# Patient Record
Sex: Female | Born: 1950 | Race: Black or African American | Hispanic: No | Marital: Single | Smoking: Never smoker
Health system: Southern US, Community
[De-identification: ages and names within clinical notes are randomized; demographics above are authoritative.]

## PROBLEM LIST (undated history)

## (undated) DIAGNOSIS — I10 Essential (primary) hypertension: Secondary | ICD-10-CM

---

## 2011-04-28 DIAGNOSIS — G4733 Obstructive sleep apnea (adult) (pediatric): Secondary | ICD-10-CM | POA: Diagnosis present

## 2011-12-08 DIAGNOSIS — M170 Bilateral primary osteoarthritis of knee: Secondary | ICD-10-CM | POA: Diagnosis present

## 2013-08-22 DIAGNOSIS — K648 Other hemorrhoids: Secondary | ICD-10-CM | POA: Insufficient documentation

## 2013-08-22 DIAGNOSIS — K5730 Diverticulosis of large intestine without perforation or abscess without bleeding: Secondary | ICD-10-CM | POA: Insufficient documentation

## 2014-07-24 DIAGNOSIS — K430 Incisional hernia with obstruction, without gangrene: Secondary | ICD-10-CM | POA: Diagnosis present

## 2014-07-27 DIAGNOSIS — Z8619 Personal history of other infectious and parasitic diseases: Secondary | ICD-10-CM | POA: Insufficient documentation

## 2019-10-13 ENCOUNTER — Encounter (HOSPITAL_COMMUNITY): Admission: EM | Disposition: A | Discharge status: Home Health | Payer: Self-pay | Source: Home / Self Care

## 2019-10-13 ENCOUNTER — Emergency Department (HOSPITAL_COMMUNITY): Payer: Medicare (Managed Care) | Admitting: Certified Registered Nurse Anesthetist

## 2019-10-13 ENCOUNTER — Encounter (HOSPITAL_COMMUNITY): Payer: Self-pay

## 2019-10-13 ENCOUNTER — Emergency Department (HOSPITAL_COMMUNITY): Payer: Medicare (Managed Care)

## 2019-10-13 ENCOUNTER — Other Ambulatory Visit: Payer: Self-pay

## 2019-10-13 ENCOUNTER — Inpatient Hospital Stay (HOSPITAL_COMMUNITY)
Admission: EM | Admit: 2019-10-13 | Discharge: 2019-10-22 | DRG: 329 | Disposition: A | Discharge status: Home Health | Payer: Medicare (Managed Care) | Attending: Surgery | Admitting: Surgery

## 2019-10-13 DIAGNOSIS — N200 Calculus of kidney: Secondary | ICD-10-CM

## 2019-10-13 DIAGNOSIS — I959 Hypotension, unspecified: Secondary | ICD-10-CM | POA: Diagnosis not present

## 2019-10-13 DIAGNOSIS — Z9889 Other specified postprocedural states: Secondary | ICD-10-CM

## 2019-10-13 DIAGNOSIS — I10 Essential (primary) hypertension: Secondary | ICD-10-CM | POA: Diagnosis present

## 2019-10-13 DIAGNOSIS — Z6841 Body Mass Index (BMI) 40.0 and over, adult: Secondary | ICD-10-CM

## 2019-10-13 DIAGNOSIS — Z20822 Contact with and (suspected) exposure to covid-19: Secondary | ICD-10-CM | POA: Diagnosis present

## 2019-10-13 DIAGNOSIS — K460 Unspecified abdominal hernia with obstruction, without gangrene: Secondary | ICD-10-CM

## 2019-10-13 DIAGNOSIS — E119 Type 2 diabetes mellitus without complications: Secondary | ICD-10-CM | POA: Diagnosis present

## 2019-10-13 DIAGNOSIS — K55021 Focal (segmental) acute infarction of small intestine: Secondary | ICD-10-CM | POA: Diagnosis present

## 2019-10-13 DIAGNOSIS — K431 Incisional hernia with gangrene: Secondary | ICD-10-CM | POA: Diagnosis not present

## 2019-10-13 DIAGNOSIS — Z880 Allergy status to penicillin: Secondary | ICD-10-CM

## 2019-10-13 DIAGNOSIS — M170 Bilateral primary osteoarthritis of knee: Secondary | ICD-10-CM | POA: Diagnosis present

## 2019-10-13 DIAGNOSIS — K56609 Unspecified intestinal obstruction, unspecified as to partial versus complete obstruction: Secondary | ICD-10-CM

## 2019-10-13 DIAGNOSIS — G4733 Obstructive sleep apnea (adult) (pediatric): Secondary | ICD-10-CM | POA: Diagnosis present

## 2019-10-13 DIAGNOSIS — K567 Ileus, unspecified: Secondary | ICD-10-CM | POA: Diagnosis not present

## 2019-10-13 DIAGNOSIS — K430 Incisional hernia with obstruction, without gangrene: Secondary | ICD-10-CM | POA: Diagnosis present

## 2019-10-13 DIAGNOSIS — D1771 Benign lipomatous neoplasm of kidney: Secondary | ICD-10-CM

## 2019-10-13 DIAGNOSIS — K5730 Diverticulosis of large intestine without perforation or abscess without bleeding: Secondary | ICD-10-CM | POA: Diagnosis present

## 2019-10-13 HISTORY — DX: Essential (primary) hypertension: I10

## 2019-10-13 HISTORY — PX: BOWEL RESECTION: SHX1257

## 2019-10-13 HISTORY — PX: APPLICATION OF WOUND VAC: SHX5189

## 2019-10-13 HISTORY — PX: LAPAROTOMY: SHX154

## 2019-10-13 HISTORY — PX: INCISIONAL HERNIA REPAIR: SHX193

## 2019-10-13 LAB — CBC WITH DIFFERENTIAL/PLATELET
Abs Immature Granulocytes: 0.05 10*3/uL (ref 0.00–0.07)
Basophils Absolute: 0 10*3/uL (ref 0.0–0.1)
Basophils Relative: 0 %
Eosinophils Absolute: 0 10*3/uL (ref 0.0–0.5)
Eosinophils Relative: 0 %
HCT: 42.4 % (ref 36.0–46.0)
Hemoglobin: 14 g/dL (ref 12.0–15.0)
Immature Granulocytes: 0 %
Lymphocytes Relative: 9 %
Lymphs Abs: 1.5 10*3/uL (ref 0.7–4.0)
MCH: 30.4 pg (ref 26.0–34.0)
MCHC: 33 g/dL (ref 30.0–36.0)
MCV: 92.2 fL (ref 80.0–100.0)
Monocytes Absolute: 1.7 10*3/uL — ABNORMAL HIGH (ref 0.1–1.0)
Monocytes Relative: 11 %
Neutro Abs: 13.2 10*3/uL — ABNORMAL HIGH (ref 1.7–7.7)
Neutrophils Relative %: 80 %
Platelets: 246 10*3/uL (ref 150–400)
RBC: 4.6 MIL/uL (ref 3.87–5.11)
RDW: 13.2 % (ref 11.5–15.5)
WBC: 16.5 10*3/uL — ABNORMAL HIGH (ref 4.0–10.5)
nRBC: 0 % (ref 0.0–0.2)

## 2019-10-13 LAB — COMPREHENSIVE METABOLIC PANEL
ALT: 19 U/L (ref 0–44)
AST: 23 U/L (ref 15–41)
Albumin: 4 g/dL (ref 3.5–5.0)
Alkaline Phosphatase: 23 U/L — ABNORMAL LOW (ref 38–126)
Anion gap: 12 (ref 5–15)
BUN: 22 mg/dL (ref 8–23)
CO2: 29 mmol/L (ref 22–32)
Calcium: 9.8 mg/dL (ref 8.9–10.3)
Chloride: 101 mmol/L (ref 98–111)
Creatinine, Ser: 0.71 mg/dL (ref 0.44–1.00)
GFR calc Af Amer: >60 mL/min (ref >60)
GFR calc non Af Amer: >60 mL/min (ref >60)
Glucose, Bld: 138 mg/dL — ABNORMAL HIGH (ref 70–99)
Potassium: 3.7 mmol/L (ref 3.5–5.1)
Sodium: 142 mmol/L (ref 135–145)
Total Bilirubin: 0.7 mg/dL (ref 0.3–1.2)
Total Protein: 8.4 g/dL — ABNORMAL HIGH (ref 6.5–8.1)

## 2019-10-13 LAB — PHOSPHORUS: Phosphorus: 3.4 mg/dL (ref 2.5–4.6)

## 2019-10-13 LAB — URINALYSIS, ROUTINE W REFLEX MICROSCOPIC
Bilirubin Urine: NEGATIVE
Glucose, UA: NEGATIVE mg/dL
Hgb urine dipstick: NEGATIVE
Ketones, ur: NEGATIVE mg/dL
Nitrite: NEGATIVE
Protein, ur: NEGATIVE mg/dL
Specific Gravity, Urine: 1.01 (ref 1.005–1.030)
pH: 5 (ref 5.0–8.0)

## 2019-10-13 LAB — MAGNESIUM: Magnesium: 2.1 mg/dL (ref 1.7–2.4)

## 2019-10-13 LAB — LIPASE, BLOOD: Lipase: 37 U/L (ref 11–51)

## 2019-10-13 LAB — SARS CORONAVIRUS 2 BY RT PCR (HOSPITAL ORDER, PERFORMED IN ~~LOC~~ HOSPITAL LAB): SARS Coronavirus 2: NEGATIVE

## 2019-10-13 SURGERY — LAPAROTOMY, EXPLORATORY
Anesthesia: General | Site: Abdomen

## 2019-10-13 MED ORDER — MORPHINE SULFATE (PF) 4 MG/ML IV SOLN
4.0000 mg | Freq: Once | INTRAVENOUS | Status: AC
  Administered 2019-10-13: 4 mg via INTRAVENOUS
  Filled 2019-10-13: qty 1

## 2019-10-13 MED ORDER — IOHEXOL 300 MG/ML  SOLN
100.0000 mL | Freq: Once | INTRAMUSCULAR | Status: AC | PRN
  Administered 2019-10-13: 100 mL via INTRAVENOUS

## 2019-10-13 MED ORDER — GENTAMICIN SULFATE 40 MG/ML IJ SOLN
400.0000 mg | INTRAVENOUS | Status: AC
  Administered 2019-10-13: 400 mg via INTRAVENOUS
  Filled 2019-10-13: qty 10

## 2019-10-13 MED ORDER — DEXAMETHASONE SODIUM PHOSPHATE 10 MG/ML IJ SOLN
INTRAMUSCULAR | Status: AC
  Filled 2019-10-13: qty 1

## 2019-10-13 MED ORDER — SODIUM CHLORIDE (PF) 0.9 % IJ SOLN
INTRAMUSCULAR | Status: AC
  Filled 2019-10-13: qty 50

## 2019-10-13 MED ORDER — LIDOCAINE 2% (20 MG/ML) 5 ML SYRINGE
INTRAMUSCULAR | Status: AC
  Filled 2019-10-13: qty 5

## 2019-10-13 MED ORDER — BUPIVACAINE LIPOSOME 1.3 % IJ SUSP
20.0000 mL | Freq: Once | INTRAMUSCULAR | Status: DC
  Filled 2019-10-13: qty 20

## 2019-10-13 MED ORDER — CLINDAMYCIN PHOSPHATE 900 MG/50ML IV SOLN
900.0000 mg | INTRAVENOUS | Status: AC
  Administered 2019-10-13: 900 mg via INTRAVENOUS

## 2019-10-13 MED ORDER — LACTATED RINGERS IV BOLUS: 1000.0000 mL | Freq: Three times a day (TID) | INTRAVENOUS | Status: AC | PRN

## 2019-10-13 MED ORDER — ONDANSETRON HCL 4 MG/2ML IJ SOLN
INTRAMUSCULAR | Status: AC
  Filled 2019-10-13: qty 2

## 2019-10-13 MED ORDER — LACTATED RINGERS IV BOLUS
1000.0000 mL | Freq: Once | INTRAVENOUS | Status: AC
  Administered 2019-10-13: 1000 mL via INTRAVENOUS

## 2019-10-13 MED ORDER — FENTANYL CITRATE (PF) 250 MCG/5ML IJ SOLN
INTRAMUSCULAR | Status: AC
  Filled 2019-10-13: qty 5

## 2019-10-13 MED ORDER — CLINDAMYCIN PHOSPHATE 900 MG/50ML IV SOLN
INTRAVENOUS | Status: AC
  Filled 2019-10-13: qty 50

## 2019-10-13 MED ORDER — MEPERIDINE HCL 50 MG/ML IJ SOLN: 6.2500 mg | INTRAMUSCULAR | Status: DC | PRN

## 2019-10-13 MED ORDER — SODIUM CHLORIDE 0.9 % IV BOLUS
1000.0000 mL | Freq: Once | INTRAVENOUS | Status: AC
  Administered 2019-10-13: 1000 mL via INTRAVENOUS

## 2019-10-13 MED ORDER — BUPIVACAINE HCL 0.25 % IJ SOLN
INTRAMUSCULAR | Status: AC
  Filled 2019-10-13: qty 1

## 2019-10-13 MED ORDER — MIDAZOLAM HCL 2 MG/2ML IJ SOLN
INTRAMUSCULAR | Status: AC
  Filled 2019-10-13: qty 2

## 2019-10-13 MED ORDER — HYDROMORPHONE HCL 1 MG/ML IJ SOLN: 0.2500 mg | INTRAMUSCULAR | Status: DC | PRN

## 2019-10-13 MED ORDER — ONDANSETRON HCL 4 MG/2ML IJ SOLN
4.0000 mg | Freq: Once | INTRAMUSCULAR | Status: AC | PRN
  Administered 2019-10-14: 4 mg via INTRAVENOUS

## 2019-10-13 MED ORDER — LORAZEPAM 2 MG/ML IJ SOLN
1.0000 mg | Freq: Once | INTRAMUSCULAR | Status: AC
  Administered 2019-10-13: 1 mg via INTRAVENOUS
  Filled 2019-10-13: qty 1

## 2019-10-13 MED ORDER — PROPOFOL 10 MG/ML IV BOLUS
INTRAVENOUS | Status: AC
  Filled 2019-10-13: qty 20

## 2019-10-13 MED ORDER — CHLORHEXIDINE GLUCONATE CLOTH 2 % EX PADS
6.0000 | MEDICATED_PAD | Freq: Once | CUTANEOUS | Status: AC
  Administered 2019-10-13: 6 via TOPICAL

## 2019-10-13 MED ORDER — ROCURONIUM BROMIDE 10 MG/ML (PF) SYRINGE
PREFILLED_SYRINGE | INTRAVENOUS | Status: AC
  Filled 2019-10-13: qty 10

## 2019-10-13 SURGICAL SUPPLY — 92 items
APPLIER CLIP 5 13 M/L LIGAMAX5 (MISCELLANEOUS)
APPLIER CLIP ROT 10 11.4 M/L (STAPLE)
CABLE HIGH FREQUENCY MONO STRZ (ELECTRODE) ×4 IMPLANT
CANISTER WOUND CARE 500ML ATS (WOUND CARE) ×2 IMPLANT
CELLS DAT CNTRL 66122 CELL SVR (MISCELLANEOUS) ×2 IMPLANT
CHLORAPREP W/TINT 26 (MISCELLANEOUS) ×6 IMPLANT
CLIP APPLIE 5 13 M/L LIGAMAX5 (MISCELLANEOUS) IMPLANT
CLIP APPLIE ROT 10 11.4 M/L (STAPLE) IMPLANT
COUNTER NEEDLE 20 DBL MAG RED (NEEDLE) ×4 IMPLANT
COVER MAYO STAND STRL (DRAPES) ×12 IMPLANT
COVER SURGICAL LIGHT HANDLE (MISCELLANEOUS) ×4 IMPLANT
COVER WAND RF STERILE (DRAPES) IMPLANT
DECANTER SPIKE VIAL GLASS SM (MISCELLANEOUS) ×4 IMPLANT
DRAIN CHANNEL 19F RND (DRAIN) IMPLANT
DRAPE LAPAROSCOPIC ABDOMINAL (DRAPES) ×4 IMPLANT
DRAPE SURG IRRIG POUCH 19X23 (DRAPES) ×4 IMPLANT
DRSG OPSITE POSTOP 4X10 (GAUZE/BANDAGES/DRESSINGS) IMPLANT
DRSG OPSITE POSTOP 4X6 (GAUZE/BANDAGES/DRESSINGS) IMPLANT
DRSG OPSITE POSTOP 4X8 (GAUZE/BANDAGES/DRESSINGS) IMPLANT
DRSG TEGADERM 2-3/8X2-3/4 SM (GAUZE/BANDAGES/DRESSINGS) ×10 IMPLANT
DRSG TEGADERM 4X4.75 (GAUZE/BANDAGES/DRESSINGS) IMPLANT
DRSG VAC ATS MED SENSATRAC (GAUZE/BANDAGES/DRESSINGS) ×2 IMPLANT
ELECT PENCIL ROCKER SW 15FT (MISCELLANEOUS) ×2 IMPLANT
ELECT REM PT RETURN 15FT ADLT (MISCELLANEOUS) ×4 IMPLANT
ENDOLOOP SUT PDS II  0 18 (SUTURE)
ENDOLOOP SUT PDS II 0 18 (SUTURE) IMPLANT
EVACUATOR SILICONE 100CC (DRAIN) IMPLANT
GAUZE SPONGE 2X2 8PLY STRL LF (GAUZE/BANDAGES/DRESSINGS) ×2 IMPLANT
GAUZE SPONGE 4X4 12PLY STRL (GAUZE/BANDAGES/DRESSINGS) ×4 IMPLANT
GLOVE BIO SURGEON STRL SZ7.5 (GLOVE) ×2 IMPLANT
GLOVE BIOGEL PI IND STRL 7.5 (GLOVE) IMPLANT
GLOVE BIOGEL PI IND STRL 8 (GLOVE) IMPLANT
GLOVE BIOGEL PI INDICATOR 7.5 (GLOVE) ×6
GLOVE BIOGEL PI INDICATOR 8 (GLOVE) ×2
GLOVE ECLIPSE 8.0 STRL XLNG CF (GLOVE) ×8 IMPLANT
GLOVE INDICATOR 8.0 STRL GRN (GLOVE) ×10 IMPLANT
GOWN STRL REUS W/ TWL LRG LVL3 (GOWN DISPOSABLE) IMPLANT
GOWN STRL REUS W/TWL LRG LVL3 (GOWN DISPOSABLE) ×2
GOWN STRL REUS W/TWL XL LVL3 (GOWN DISPOSABLE) ×12 IMPLANT
HANDLE SUCTION POOLE (INSTRUMENTS) IMPLANT
IRRIG SUCT STRYKERFLOW 2 WTIP (MISCELLANEOUS) ×4
IRRIGATION SUCT STRKRFLW 2 WTP (MISCELLANEOUS) ×2 IMPLANT
KIT TURNOVER KIT A (KITS) ×2 IMPLANT
LEGGING LITHOTOMY PAIR STRL (DRAPES) IMPLANT
NDL INSUFFLATION 14GA 150MM (NEEDLE) IMPLANT
NEEDLE INSUFFLATION 14GA 150MM (NEEDLE) ×4 IMPLANT
PAD POSITIONING PINK XL (MISCELLANEOUS) ×4 IMPLANT
PENCIL SMOKE EVACUATOR (MISCELLANEOUS) IMPLANT
PORT LAP GEL ALEXIS MED 5-9CM (MISCELLANEOUS) IMPLANT
PROTECTOR NERVE ULNAR (MISCELLANEOUS) IMPLANT
RETRACTOR WND ALEXIS 18 MED (MISCELLANEOUS) IMPLANT
RTRCTR WOUND ALEXIS 18CM MED (MISCELLANEOUS) ×4
SCISSORS LAP 5X35 DISP (ENDOMECHANICALS) ×4 IMPLANT
SEALER TISSUE G2 STRG ARTC 35C (ENDOMECHANICALS) IMPLANT
SET TUBE SMOKE EVAC HIGH FLOW (TUBING) ×4 IMPLANT
SLEEVE ADV FIXATION 5X100MM (TROCAR) IMPLANT
SLEEVE XCEL OPT CAN 5 100 (ENDOMECHANICALS) IMPLANT
SPONGE GAUZE 2X2 STER 10/PKG (GAUZE/BANDAGES/DRESSINGS) ×2
SPONGE LAP 18X18 RF (DISPOSABLE) ×2 IMPLANT
STAPLER 90 3.5 STAND SLIM (STAPLE) ×4
STAPLER 90 3.5 STD SLIM (STAPLE) IMPLANT
STAPLER PROXIMATE 75MM BLUE (STAPLE) ×2 IMPLANT
STAPLER VISISTAT 35W (STAPLE) IMPLANT
SUCTION POOLE HANDLE (INSTRUMENTS) ×4
SURGILUBE 2OZ TUBE FLIPTOP (MISCELLANEOUS) IMPLANT
SUT MNCRL AB 4-0 PS2 18 (SUTURE) ×4 IMPLANT
SUT PDS AB 1 CT1 27 (SUTURE) ×4 IMPLANT
SUT PDS AB 1 CTX 36 (SUTURE) ×8 IMPLANT
SUT PDS AB 1 TP1 96 (SUTURE) IMPLANT
SUT PROLENE 0 CT 2 (SUTURE) IMPLANT
SUT PROLENE 2 0 SH DA (SUTURE) IMPLANT
SUT SILK 2 0 (SUTURE) ×4
SUT SILK 2 0 SH CR/8 (SUTURE) ×6 IMPLANT
SUT SILK 2-0 18XBRD TIE 12 (SUTURE) ×2 IMPLANT
SUT SILK 3 0 (SUTURE) ×2
SUT SILK 3 0 SH CR/8 (SUTURE) ×4 IMPLANT
SUT SILK 3-0 18XBRD TIE 12 (SUTURE) ×2 IMPLANT
SUT VICRYL 0 UR6 27IN ABS (SUTURE) ×4 IMPLANT
SYS LAPSCP GELPORT 120MM (MISCELLANEOUS)
SYSTEM LAPSCP GELPORT 120MM (MISCELLANEOUS) IMPLANT
TAPE UMBILICAL COTTON 1/8X30 (MISCELLANEOUS) ×4 IMPLANT
TOWEL OR 17X26 10 PK STRL BLUE (TOWEL DISPOSABLE) IMPLANT
TOWEL OR NON WOVEN STRL DISP B (DISPOSABLE) ×4 IMPLANT
TRAY COLON PACK (CUSTOM PROCEDURE TRAY) ×4 IMPLANT
TRAY FOLEY MTR SLVR 16FR STAT (SET/KITS/TRAYS/PACK) ×2 IMPLANT
TROCAR ADV FIXATION 5X100MM (TROCAR) IMPLANT
TROCAR BLADELESS OPT 5 100 (ENDOMECHANICALS) ×4 IMPLANT
TROCAR XCEL NON-BLD 11X100MML (ENDOMECHANICALS) IMPLANT
TUBING CONNECTING 10 (TUBING) IMPLANT
TUBING CONNECTING 10' (TUBING)
TUBING IRRIGATION (MISCELLANEOUS) ×2 IMPLANT
YANKAUER SUCT BULB TIP NO VENT (SUCTIONS) ×2 IMPLANT

## 2019-10-13 NOTE — ED Provider Notes (Signed)
[facility] DEPT Provider Note   CSN: [id number] Arrival date & time: 10/13/19  1808     History Chief Complaint  Patient presents with  . Emesis  . Abdominal Pain    Stephanie Blanchard is a 69 y.o. female.  Pt presents to the ED today with n/v and abdominal pain.  Pt has a known ventral hernia, but she said it is "out."  She denies any f/c.  No diarrhea.  EMS gave her zofran IV en route.  Pt has no covid sx or exposures.  She has had both doses of her vaccine.        Past Medical History:  Diagnosis Date  . Hypertension     Patient Active Problem List   Diagnosis Date Noted  . 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 Nephrolithiasis 10/13/2019  . SBO (small bowel obstruction) ([place]) 10/13/2019  . Personal history of other infectious and parasitic diseases 07/27/2014  . Incarcerated incisional hernia 07/24/2014  . Diverticulosis of sigmoid colon 08/22/2013  . Internal hemorrhoid 08/22/2013  . Bilateral primary osteoarthritis of knee 12/08/2011  . Obstructive sleep apnea 04/28/2011  . Morbid obesity with body mass index (BMI) of 45.0 to 49.9 in adult [facility]) 09/30/2010  . HTN (hypertension) 08/01/2009    Past Surgical History:  Procedure Laterality Date  . CESAREAN SECTION  1980     OB History   No obstetric history on file.     History reviewed. No pertinent family history.  Social History   Tobacco Use  . Smoking status: Never Smoker  . Smokeless tobacco: Never Used  Substance Use Topics  . Alcohol use: Yes    Comment: occ  . Drug use: Never    Home Medications Prior to Admission medications   Medication Sig Start Date End Date Taking? Authorizing Provider  losartan-hydrochlorothiazide (HYZAAR) 100-25 MG tablet Take 1 tablet by mouth daily. 10/08/19  Yes [provider]    Allergies    Penicillins  Review of Systems   Review of Systems  Gastrointestinal: Positive for abdominal pain, nausea  and vomiting.  All other systems reviewed and are negative.   Physical Exam Updated Vital Signs BP (!) 138/57 (BP Location: Right Arm)   Pulse 84   Temp 98.6 F (37 C) (Oral)   Resp 16   Ht 5\' 2"  (1.575 m)   Wt 123.8 kg   SpO2 100%   BMI 49.93 kg/m   Physical Exam Vitals and nursing note reviewed.  Constitutional:      Appearance: She is well-developed. She is obese.  HENT:     Head: Normocephalic and atraumatic.     Mouth/Throat:     Mouth: Mucous membranes are dry.  Eyes:     Extraocular Movements: Extraocular movements intact.     Pupils: Pupils are equal, round, and reactive to light.  Cardiovascular:     Rate and Rhythm: Normal rate and regular rhythm.  Pulmonary:     Effort: Pulmonary effort is normal.     Breath sounds: Normal breath sounds.  Abdominal:     General: Abdomen is flat.     Palpations: Abdomen is soft.     Hernia: A hernia is present. Hernia is present in the ventral area.  Skin:    General: Skin is warm.     Capillary Refill: Capillary refill takes less than 2 seconds.  Neurological:     General: No focal deficit present.     Mental Status: She is  alert and oriented to person, place, and time.  Psychiatric:        Mood and Affect: Mood normal.        Behavior: Behavior normal.     ED Results / Procedures / Treatments   Labs (all labs ordered are listed, but only abnormal results are displayed) Labs Reviewed  CBC WITH DIFFERENTIAL/PLATELET - Abnormal; Notable for the following components:      Result Value   WBC 16.5 (*)    Neutro Abs 13.2 (*)    Monocytes Absolute 1.7 (*)    All other components within normal limits  COMPREHENSIVE METABOLIC PANEL - Abnormal; Notable for the following components:   Glucose, Bld 138 (*)    Total Protein 8.4 (*)    Alkaline Phosphatase 23 (*)    All other components within normal limits  URINALYSIS, ROUTINE W REFLEX MICROSCOPIC - Abnormal; Notable for the following components:   Leukocytes,Ua TRACE  (*)    Bacteria, UA RARE (*)    All other components within normal limits  SARS CORONAVIRUS 2 BY RT PCR (HOSPITAL ORDER, [facility])  LIPASE, BLOOD  MAGNESIUM  PHOSPHORUS  PREALBUMIN  HEMOGLOBIN A1C  CBC  BASIC METABOLIC PANEL    EKG EKG Interpretation  Date/Time:  Thursday October 13 2019 22:50:06 EDT Ventricular Rate:  81 PR Interval:    QRS Duration: 97 QT Interval:  404 QTC Calculation: 469 R Axis:   17 Text Interpretation: Sinus rhythm Biatrial enlargement Left ventricular hypertrophy Anterior Q waves, possibly due to LVH No old tracing to compare Confirmed by Isla Pence [phone]) on 10/13/2019 11:09:40 PM   Radiology CT ABDOMEN PELVIS W CONTRAST  Result Date: 10/13/2019 CLINICAL DATA:  Nausea and vomiting.  Right lower quadrant mass. EXAM: CT ABDOMEN AND PELVIS WITH CONTRAST TECHNIQUE: Multidetector CT imaging of the abdomen and pelvis was performed using the standard protocol following bolus administration of intravenous contrast. CONTRAST:  121mL OMNIPAQUE IOHEXOL 300 MG/ML  SOLN COMPARISON:  None. FINDINGS: Lower chest: No acute abnormality. Hepatobiliary: No focal liver abnormality is seen. No gallstones, gallbladder wall thickening, or biliary dilatation. Pancreas: Unremarkable. No pancreatic ductal dilatation or surrounding inflammatory changes. Spleen: Normal in size without focal abnormality. Adrenals/Urinary Tract: The adrenal glands are unremarkable. 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 Subcentimeter low-density lesion in the upper pole of the right kidney is too small to characterize. 1.6 cm simple cyst in the left kidney. Tiny nonobstructive calculi in the lower poles of both kidneys. No hydronephrosis. The bladder is unremarkable. Stomach/Bowel: Large right infraumbilical hernia containing fat and a single loop of mildly dilated small bowel with trace ascites and mild surrounding inflammatory changes. No wall thickening  or pneumatosis. Mild dilatation of the small bowel immediately proximal to the hernia. Mild left-sided colonic diverticulosis. The stomach is within normal limits. Normal appendix. Vascular/Lymphatic: Aortic atherosclerosis. No enlarged abdominal or pelvic lymph nodes. Reproductive: Uterus and bilateral adnexa are unremarkable. Other: No free fluid or pneumoperitoneum. Musculoskeletal: No acute or significant osseous findings. IMPRESSION: 1. Large right infraumbilical hernia containing fat and a single loop of mildly dilated small bowel with trace ascites and mild surrounding inflammatory changes, concerning for strangulation. Mild dilatation of the small bowel immediately proximal to the hernia, consistent with early obstruction. 2. Bilateral nonobstructive nephrolithiasis. 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 4. Aortic Atherosclerosis (ICD10-I70.0). Electronically Signed   By: Titus Dubin M.D.   On: 10/13/2019 21:04    Procedures Hernia reduction  Date/Time: 10/13/2019 9:41 PM Performed by: Isla Pence, MD Authorized by: Isla Pence, MD  Consent: Verbal consent obtained. Risks and benefits: risks, benefits and alternatives were discussed Consent given by: patient Patient identity confirmed: verbally with patient Time out: Immediately prior to procedure a "time out" was called to verify the correct patient, procedure, equipment, support staff and site/side marked as required.  Sedation: Patient sedated: no  Patient tolerance: patient tolerated the procedure well with no immediate complications Comments: I was unable to get hernia to reduce.    (including critical care time)  Medications Ordered in ED Medications  sodium chloride (PF) 0.9 % injection (has no administration in time range)  lactated ringers bolus 1,000 mL (has no administration in time range)  clindamycin (CLEOCIN) IVPB 900 mg (has no administration in time range)    And  gentamicin (GARAMYCIN) 400 mg in  dextrose 5 % 100 mL IVPB (has no administration in time range)  bupivacaine liposome (EXPAREL) 1.3 % injection 266 mg (has no administration in time range)  sodium chloride 0.9 % bolus 1,000 mL (0 mLs Intravenous Stopped 10/13/19 2035)  morphine 4 MG/ML injection 4 mg (4 mg Intravenous Given 10/13/19 1848)  LORazepam (ATIVAN) injection 1 mg (1 mg Intravenous Given 10/13/19 2235)  iohexol (OMNIPAQUE) 300 MG/ML solution 100 mL (100 mLs Intravenous Contrast Given 10/13/19 2037)  lactated ringers bolus 1,000 mL (1,000 mLs Intravenous New Bag/Given 10/13/19 2235)  Chlorhexidine Gluconate Cloth 2 % PADS 6 each (6 each Topical Given 10/13/19 2238)    ED Course  I have reviewed the triage vital signs and the nursing notes.  Pertinent labs & imaging results that were available during my care of the patient were reviewed by me and considered in my medical decision making (see chart for details).    MDM Rules/Calculators/A&P                      Pt d/w Dr. Johney Maine (surgery).  He requests NG tube and he will come see pt.  Dr. Johney Maine did see pt and he did take pt to the OR.  Final Clinical Impression(s) / ED Diagnoses Final diagnoses:  Hernia with strangulation  SBO (small bowel obstruction) ([place])    Rx / DC Orders ED Discharge Orders    None       Isla Pence, MD 10/13/19 2310

## 2019-10-13 NOTE — ED Triage Notes (Signed)
Pt arrives [facility] from home. Pt is from [place] and is in town visiting. Pt reports N/V beginning yesterday at 10 am. Pt reports that while vomiting she felt a "pop". Pt reports she now has a mass in the RLQ. Pt reports hx of hernia without complication.

## 2019-10-13 NOTE — Anesthesia Preprocedure Evaluation (Signed)
Anesthesia Evaluation  Patient identified by MRN, date of birth, ID band Patient awake    Reviewed: Allergy & Precautions, NPO status , Patient's Chart, lab work & pertinent test results  Airway Mallampati: II  TM Distance: >3 FB Neck ROM: Full    Dental   Pulmonary sleep apnea and Continuous Positive Airway Pressure Ventilation ,    Pulmonary exam normal        Cardiovascular hypertension, Pt. on medications Normal cardiovascular exam     Neuro/Psych    GI/Hepatic   Endo/Other    Renal/GU      Musculoskeletal   Abdominal   Peds  Hematology   Anesthesia Other Findings   Reproductive/Obstetrics                             Anesthesia Physical Anesthesia Plan  ASA: III and emergent  Anesthesia Plan: General   Post-op Pain Management:    Induction: Intravenous, Rapid sequence and Cricoid pressure planned  PONV Risk Score and Plan: 3 and Ondansetron, Midazolam and Treatment may vary due to age or medical condition  Airway Management Planned: Oral ETT  Additional Equipment:   Intra-op Plan:   Post-operative Plan: Possible Post-op intubation/ventilation  Informed Consent: I have reviewed the patients History and Physical, chart, labs and discussed the procedure including the risks, benefits and alternatives for the proposed anesthesia with the patient or authorized representative who has indicated his/her understanding and acceptance.       Plan Discussed with: CRNA and Surgeon  Anesthesia Plan Comments:         Anesthesia Quick Evaluation

## 2019-10-13 NOTE — ED Notes (Signed)
Pt provided with warm blanket. 

## 2019-10-13 NOTE — H&P (Signed)
Stephanie Blanchard  Sep 29, 1950 [id number]  CARE TEAM:  PCP: System, Pcp Not In  Outpatient Care Team: Patient Care Team: System, Pcp Not In as PCP - General  Inpatient Treatment Team: Treatment Team: Attending Provider: Isla Pence, MD; Technician: Gwendolyn Fill, NT; Registered Nurse: Markus Daft, RN; Registered Nurse: [place], [place], RN; Respiratory Therapist: Nelly Laurence, RRT; Consulting Physician: Edison Pace, Md, MD   This patient is a 69 y.o.female who presents today for surgical evaluation at the request of Dr Gilford Raid, [facility] ED.   Chief complaint / Reason for evaluation: Chronic incisional hernia now incarcerated.  "Can we just do the surgery now?"  Morbidly obese woman with prior history of C-section through low midline incisions visiting from [place].  Has had a hernia lump known for many years.  CAT scan at outside hospital system talks about fat-containing hernia but no bowel.  Patient claims that the hernia is usually been reducible.  However it came out and stayed out since yesterday.  Worsening pain with nausea and vomiting.  Concerned.  Came into the emergency room tonight.  Seen by emergency MD.  Not felt to be reducible.  CAT scan shows a narrow fascial defect with a moderate size hernia sac containing small bowel with dilatation and transition point.  Concerning for incarcerated hernia causing small bowel obstruction.  Surgical consultation requested.  Patient denies any cardiac or pulmonary issues.  She does not smoke.  He has been tested and denies any diagnosis of diabetes.  No kidney problems.  She is not on any blood thinners.  She is not had any abdominal surgery since her last C-section over 40 years ago.  No history of skin infections.  No history of prior hernia repairs or any mesh.  No major bladder or bowel issues need for emergency surgery   Assessment  Stephanie Blanchard  69 y.o. 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   Nephrolithiasis   Bilateral primary osteoarthritis of knee   Infraumbilical incisional hernia incarcerated with small bowel causing bowel obstruction.  Edema concerning for strangulation  Plan:  Hospital admission.  Emergent operative exploration.  Diagnostic laparoscopy with laparoscopic reduction.  Possible bowel resection.  Probable primary open repair.   The anatomy & physiology of the digestive tract was discussed.  The pathophysiology of hernias with incarceration and strangulation and bowel obstruction was discussed.   Natural history risks without surgery such as death was discussed.  I recommended abdominal exploration to diagnose & treat the source of the problem.  Laparoscopic & open techniques were discussed.   Risks such as bleeding, infection, abscess, leak, reoperation, bowel resection, possible ostomy, injury to other organs, need for repair of tissues / organs, hernia, heart attack, death, and other risks were discussed.   The risks of no intervention will lead to serious problems including death.   I expressed a good likelihood that surgery will address the problem.    Goals of post-operative recovery were discussed as well.  We will work to minimize complications although risks in an emergent setting are high.   Questions were answered.  Mother listening on the cell phone up in [place] as well.  The patient expressed understanding & wishes to proceed  with surgery.       IV fluid rehydration.  Nausea control.  Diabetic control.  VTE prophylaxis- SCDs, etc  Mobilize as tolerated to help recovery  45 minutes spent in review, evaluation, examination, counseling, and coordination of care.  More than 50% of that time was spent in counseling.  Adin Hector,  MD, FACS, MASCRS Gastrointestinal and Minimally Invasive Surgery  [facility] [address] [place], [place] [address] [phone] Fax [phone] Main/Paging  CONTACT INFORMATION: Weekday (9AM-5PM) concerns: Call [facility] main office at [phone] Weeknight (5PM-9AM) or Weekend/Holiday concerns: Check [email or web address] for General Surgery [facility] coverage (Please, do not use SecureChat as it is not reliable communication to operating surgeons for immediate patient care)      10/13/2019      Past Medical History:  Diagnosis Date  . Hypertension     Past Surgical History:  Procedure Laterality Date  . ABDOMINAL SURGERY    . CESAREAN SECTION      Social History   Socioeconomic History  . Marital status: Single    Spouse name: Not on file  . Number of children: Not on file  . Years of education: Not on file  . Highest education level: Not on file  Occupational History  . Not on file  Tobacco Use  . Smoking status: Never Smoker  . Smokeless tobacco: Never Used  Substance and Sexual Activity  . Alcohol use: Yes    Comment: occ  . Drug use: Never  . Sexual activity: Not on file  Other Topics Concern  . Not on file  Social History Narrative  . Not on file   Social Determinants of Health   Financial Resource Strain:   . Difficulty of Paying Living Expenses:   Food Insecurity:   . Worried About Charity fundraiser in the Last Year:   . Arboriculturist in the Last Year:   Transportation Needs:   . Film/video editor (Medical):   Marland Kitchen Lack of Transportation (Non-Medical):   Physical Activity:   . Days of Exercise per Week:   . Minutes of Exercise per Session:   Stress:   . Feeling of Stress :   Social Connections:   . Frequency of Communication with Friends and Family:   . Frequency of Social Gatherings with Friends and Family:   . Attends Religious Services:   . Active Member of Clubs or Organizations:   . Attends Theatre manager Meetings:   Marland Kitchen Marital Status:   Intimate Partner Violence:   . Fear of Current or Ex-Partner:   . Emotionally Abused:   Marland Kitchen Physically Abused:   . Sexually Abused:     History reviewed. No pertinent family history.  Current Facility-Administered Medications  Medication Dose Route Frequency Provider Last Rate Last Admin  . LORazepam (ATIVAN) injection 1 mg  1 mg Intravenous Once Isla Pence, MD      . sodium chloride (PF) 0.9 % injection            Current Outpatient Medications  Medication Sig Dispense Refill  . losartan-hydrochlorothiazide (HYZAAR) 100-25 MG tablet Take 1 tablet by mouth daily.       Allergies  Allergen Reactions  . Penicillins     Did it involve swelling of the face/tongue/throat, SOB, or low BP? N Did it involve sudden or severe rash/hives, skin peeling, or any reaction on the inside of your mouth or nose? Y Did you need to seek medical  attention at a hospital or doctor's office? N When did it last happen?Several years ago If all above answers are "NO", may proceed with cephalosporin use.     ROS:   All other systems reviewed & are negative except per HPI or as noted below: Constitutional:  No fevers, chills, sweats.  Weight stable Eyes:  No vision changes, No discharge HENT:  No sore throats, nasal drainage Lymph: No neck swelling, No bruising easily Pulmonary:  No cough, productive sputum CV: No orthopnea, PND  Patient walks 20 minutes for about 1 miles without difficulty.  No exertional chest/neck/shoulder/arm pain. GI:  No personal nor family history of GI/colon cancer, inflammatory bowel disease, irritable bowel syndrome, allergy such as Celiac Sprue, dietary/dairy problems, colitis, ulcers nor gastritis.  No recent sick contacts/gastroenteritis.  No travel outside the country.  No changes in diet.  Questionable history hepatitis C treatment in the past  renal: No UTIs, No hematuria.  + History of kidney stones Genital:  No  drainage, bleeding, masses Musculoskeletal: No severe joint pain.  Good ROM major joints Skin:  No sores or lesions.  No rashes Heme/Lymph:  No easy bleeding.  No swollen lymph nodes Neuro: No focal weakness/numbness.  No seizures Psych: No suicidal ideation.  No hallucinations  BP (!) 184/84 (BP Location: Right Arm)   Pulse 85   Temp 98.5 F (36.9 C) (Oral)   Resp 16   Ht 5\' 2"  (1.575 m)   Wt 123.8 kg   SpO2 100%   BMI 49.93 kg/m   Physical Exam: Constitutional: Not cachectic.  Hygeine adequate.  Lying on side.  Tired but not toxic.  Vitals signs as above.   Eyes: Pupils reactive, normal extraocular movements. Sclera nonicteric Neuro: CN II-XII intact.  No major focal sensory defects.  No major motor deficits. Lymph: No head/neck/groin lymphadenopathy Psych:  No severe agitation.  No severe anxiety.  Judgment & insight Adequate, Oriented x4, HENT: Normocephalic, Mucus membranes moist.  No thrush.   Neck: Supple, No tracheal deviation.  No obvious thyromegaly Chest: No pain to chest wall compression.  Good respiratory excursion.  No audible wheezing CV:  Pulses intact.   Regular rhythm.  No major extremity edema  Abdomen:  Morbidly obese.   Soft.  Nondistended.  12x10cm spherical Grapefruit size mass right of infraumbilical region with edema and swelling correlates with incarcerated hernia.  Not reducible.  Sensitive to deep pressure.  No frank fluctuance or gangrene at the surface.   No hepatomegaly.  No splenomegaly  Gen:  No inguinal hernias.  No inguinal lymphadenopathy.   Ext: No obvious deformity or contracture no significant edema.  No cyanosis Skin: No major subcutaneous nodules.  Warm and dry Musculoskeletal: Severe joint rigidity not present.  No obvious clubbing.  No digital petechiae.     Results:   Labs: Results for orders placed or performed during the hospital encounter of 10/13/19 (from the past 48 hour(s))  CBC with Differential     Status: Abnormal    Collection Time: 10/13/19  6:41 PM  Result Value Ref Range   WBC 16.5 (H) 4.0 - 10.5 K/uL   RBC 4.60 3.87 - 5.11 MIL/uL   Hemoglobin 14.0 12.0 - 15.0 g/dL   HCT 42.4 36.0 - 46.0 %   MCV 92.2 80.0 - 100.0 fL   MCH 30.4 26.0 - 34.0 pg   MCHC 33.0 30.0 - 36.0 g/dL   RDW 13.2 11.5 - 15.5 %   Platelets 246 150 - 400 K/uL  nRBC 0.0 0.0 - 0.2 %   Neutrophils Relative % 80 %   Neutro Abs 13.2 (H) 1.7 - 7.7 K/uL   Lymphocytes Relative 9 %   Lymphs Abs 1.5 0.7 - 4.0 K/uL   Monocytes Relative 11 %   Monocytes Absolute 1.7 (H) 0.1 - 1.0 K/uL   Eosinophils Relative 0 %   Eosinophils Absolute 0.0 0.0 - 0.5 K/uL   Basophils Relative 0 %   Basophils Absolute 0.0 0.0 - 0.1 K/uL   Immature Granulocytes 0 %   Abs Immature Granulocytes 0.05 0.00 - 0.07 K/uL    Comment: Performed at [facility], [place] [address], [place], [place] [address]  Comprehensive metabolic panel     Status: Abnormal   Collection Time: 10/13/19  6:41 PM  Result Value Ref Range   Sodium 142 135 - 145 mmol/L   Potassium 3.7 3.5 - 5.1 mmol/L   Chloride 101 98 - 111 mmol/L   CO2 29 22 - 32 mmol/L   Glucose, Bld 138 (H) 70 - 99 mg/dL    Comment: Glucose reference range applies only to samples taken after fasting for at least 8 hours.   BUN 22 8 - 23 mg/dL   Creatinine, Ser 0.71 0.44 - 1.00 mg/dL   Calcium 9.8 8.9 - 10.3 mg/dL   Total Protein 8.4 (H) 6.5 - 8.1 g/dL   Albumin 4.0 3.5 - 5.0 g/dL   AST 23 15 - 41 U/L   ALT 19 0 - 44 U/L   Alkaline Phosphatase 23 (L) 38 - 126 U/L   Total Bilirubin 0.7 0.3 - 1.2 mg/dL   GFR calc non Af Amer >60 >60 mL/min   GFR calc Af Amer >60 >60 mL/min   Anion gap 12 5 - 15    Comment: Performed at [facility], [place] [address] [place], [place] [address]  Lipase, blood     Status: None   Collection Time: 10/13/19  6:41 PM  Result Value Ref Range   Lipase 37 11 - 51 U/L    Comment: Performed at [facility], [place] [address],  [place], [place] [address]    Imaging / Studies: CT ABDOMEN PELVIS W CONTRAST  Result Date: 10/13/2019 CLINICAL DATA:  Nausea and vomiting.  Right lower quadrant mass. EXAM: CT ABDOMEN AND PELVIS WITH CONTRAST TECHNIQUE: Multidetector CT imaging of the abdomen and pelvis was performed using the standard protocol following bolus administration of intravenous contrast. CONTRAST:  175mL OMNIPAQUE IOHEXOL 300 MG/ML  SOLN COMPARISON:  None. FINDINGS: Lower chest: No acute abnormality. Hepatobiliary: No focal liver abnormality is seen. No gallstones, gallbladder wall thickening, or biliary dilatation. Pancreas: Unremarkable. No pancreatic ductal dilatation or surrounding inflammatory changes. Spleen: Normal in size without focal abnormality. Adrenals/Urinary Tract: The adrenal glands are unremarkable. 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 Subcentimeter low-density lesion in the upper pole of the right kidney is too small to characterize. 1.6 cm simple cyst in the left kidney. Tiny nonobstructive calculi in the lower poles of both kidneys. No hydronephrosis. The bladder is unremarkable. Stomach/Bowel: Large right infraumbilical hernia containing fat and a single loop of mildly dilated small bowel with trace ascites and mild surrounding inflammatory changes. No wall thickening or pneumatosis. Mild dilatation of the small bowel immediately proximal to the hernia. Mild left-sided colonic diverticulosis. The stomach is within normal limits. Normal appendix. Vascular/Lymphatic: Aortic atherosclerosis. No enlarged abdominal or pelvic lymph nodes. Reproductive: Uterus and bilateral adnexa  are unremarkable. Other: No free fluid or pneumoperitoneum. Musculoskeletal: No acute or significant osseous findings. IMPRESSION: 1. Large right infraumbilical hernia containing fat and a single loop of mildly dilated small bowel with trace ascites and mild surrounding inflammatory changes, concerning for strangulation.  Mild dilatation of the small bowel immediately proximal to the hernia, consistent with early obstruction. 2. Bilateral nonobstructive nephrolithiasis. 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 4. Aortic Atherosclerosis (ICD10-I70.0). Electronically Signed   By: Titus Dubin M.D.   On: 10/13/2019 21:04    Medications / Allergies: per chart  Antibiotics: Anti-infectives (From admission, onward)   None        Note: Portions of this report may have been transcribed using voice recognition software. Every effort was made to ensure accuracy; however, inadvertent computerized transcription errors may be present.   Any transcriptional errors that result from this process are unintentional.    Adin Hector, MD, FACS, MASCRS Gastrointestinal and Minimally Invasive Surgery  [facility] [address] [place], [place] [address] [phone] Fax [phone] Main/Paging  CONTACT INFORMATION: Weekday (9AM-5PM) concerns: Call [facility] main office at [phone] Weeknight (5PM-9AM) or Weekend/Holiday concerns: Check [email or web address] for General Surgery [facility] coverage (Please, do not use SecureChat as it is not reliable communication to operating surgeons for immediate patient care)      10/13/2019  9:53 PM

## 2019-10-13 NOTE — Interval H&P Note (Signed)
History and Physical Interval Note:  10/13/2019 11:07 PM  Stephanie Blanchard  has presented today for surgery, with the diagnosis of hernia.  The various methods of treatment have been discussed with the patient and family. After consideration of risks, benefits and other options for treatment, the patient has consented to  [name] a surgical intervention.  The patient's history has been reviewed, patient examined, no change in status, stable for surgery.  I have reviewed the patient's chart and labs.  Questions were answered to the patient's satisfaction.     Adin Hector

## 2019-10-14 ENCOUNTER — Encounter (HOSPITAL_COMMUNITY): Payer: Self-pay

## 2019-10-14 ENCOUNTER — Inpatient Hospital Stay (HOSPITAL_COMMUNITY): Payer: Medicare (Managed Care)

## 2019-10-14 DIAGNOSIS — I10 Essential (primary) hypertension: Secondary | ICD-10-CM | POA: Diagnosis present

## 2019-10-14 DIAGNOSIS — M170 Bilateral primary osteoarthritis of knee: Secondary | ICD-10-CM | POA: Diagnosis present

## 2019-10-14 DIAGNOSIS — N200 Calculus of kidney: Secondary | ICD-10-CM | POA: Diagnosis present

## 2019-10-14 DIAGNOSIS — K55021 Focal (segmental) acute infarction of small intestine: Secondary | ICD-10-CM | POA: Diagnosis present

## 2019-10-14 DIAGNOSIS — Z6841 Body Mass Index (BMI) 40.0 and over, adult: Secondary | ICD-10-CM | POA: Diagnosis not present

## 2019-10-14 DIAGNOSIS — I959 Hypotension, unspecified: Secondary | ICD-10-CM | POA: Diagnosis not present

## 2019-10-14 DIAGNOSIS — Z20822 Contact with and (suspected) exposure to covid-19: Secondary | ICD-10-CM | POA: Diagnosis present

## 2019-10-14 DIAGNOSIS — G4733 Obstructive sleep apnea (adult) (pediatric): Secondary | ICD-10-CM | POA: Diagnosis present

## 2019-10-14 DIAGNOSIS — K5730 Diverticulosis of large intestine without perforation or abscess without bleeding: Secondary | ICD-10-CM | POA: Diagnosis present

## 2019-10-14 DIAGNOSIS — D1771 Benign lipomatous neoplasm of kidney: Secondary | ICD-10-CM | POA: Diagnosis present

## 2019-10-14 DIAGNOSIS — K430 Incisional hernia with obstruction, without gangrene: Secondary | ICD-10-CM | POA: Diagnosis present

## 2019-10-14 DIAGNOSIS — E119 Type 2 diabetes mellitus without complications: Secondary | ICD-10-CM | POA: Diagnosis present

## 2019-10-14 DIAGNOSIS — K431 Incisional hernia with gangrene: Secondary | ICD-10-CM | POA: Diagnosis present

## 2019-10-14 DIAGNOSIS — K567 Ileus, unspecified: Secondary | ICD-10-CM | POA: Diagnosis not present

## 2019-10-14 DIAGNOSIS — Z880 Allergy status to penicillin: Secondary | ICD-10-CM | POA: Diagnosis not present

## 2019-10-14 LAB — CBC
HCT: 40.5 % (ref 36.0–46.0)
Hemoglobin: 12.8 g/dL (ref 12.0–15.0)
MCH: 29.8 pg (ref 26.0–34.0)
MCHC: 31.6 g/dL (ref 30.0–36.0)
MCV: 94.2 fL (ref 80.0–100.0)
Platelets: 223 10*3/uL (ref 150–400)
RBC: 4.3 MIL/uL (ref 3.87–5.11)
RDW: 13.5 % (ref 11.5–15.5)
WBC: 19 10*3/uL — ABNORMAL HIGH (ref 4.0–10.5)
nRBC: 0 % (ref 0.0–0.2)

## 2019-10-14 LAB — HEMOGLOBIN A1C
Hgb A1c MFr Bld: 6 % — ABNORMAL HIGH (ref 4.8–5.6)
Mean Plasma Glucose: 125.5 mg/dL

## 2019-10-14 LAB — BASIC METABOLIC PANEL
Anion gap: 10 (ref 5–15)
BUN: 20 mg/dL (ref 8–23)
CO2: 27 mmol/L (ref 22–32)
Calcium: 8.9 mg/dL (ref 8.9–10.3)
Chloride: 103 mmol/L (ref 98–111)
Creatinine, Ser: 0.88 mg/dL (ref 0.44–1.00)
GFR calc Af Amer: >60 mL/min (ref >60)
GFR calc non Af Amer: >60 mL/min (ref >60)
Glucose, Bld: 143 mg/dL — ABNORMAL HIGH (ref 70–99)
Potassium: 3.6 mmol/L (ref 3.5–5.1)
Sodium: 140 mmol/L (ref 135–145)

## 2019-10-14 LAB — PREALBUMIN: Prealbumin: 25 mg/dL (ref 18–38)

## 2019-10-14 LAB — HIV ANTIBODY (ROUTINE TESTING W REFLEX): HIV Screen 4th Generation wRfx: NONREACTIVE

## 2019-10-14 MED ORDER — LACTATED RINGERS IR SOLN
Status: DC | PRN
  Administered 2019-10-14: 1000 mL

## 2019-10-14 MED ORDER — SIMETHICONE 80 MG PO CHEW: 40.0000 mg | CHEWABLE_TABLET | Freq: Four times a day (QID) | ORAL | Status: DC | PRN

## 2019-10-14 MED ORDER — ALUM & MAG HYDROXIDE-SIMETH 200-200-20 MG/5ML PO SUSP: 30.0000 mL | Freq: Four times a day (QID) | ORAL | Status: DC | PRN

## 2019-10-14 MED ORDER — BISACODYL 10 MG RE SUPP
10.0000 mg | Freq: Every day | RECTAL | Status: DC | PRN
  Administered 2019-10-17 – 2019-10-19 (×2): 10 mg via RECTAL
  Filled 2019-10-14 (×2): qty 1

## 2019-10-14 MED ORDER — PHENYLEPHRINE HCL-NACL 10-0.9 MG/250ML-% IV SOLN
INTRAVENOUS | Status: DC | PRN
  Administered 2019-10-14: 100 ug/min via INTRAVENOUS

## 2019-10-14 MED ORDER — 0.9 % SODIUM CHLORIDE (POUR BTL) OPTIME
TOPICAL | Status: DC | PRN
  Administered 2019-10-14: 1000 mL

## 2019-10-14 MED ORDER — CIPROFLOXACIN IN D5W 400 MG/200ML IV SOLN
400.0000 mg | Freq: Two times a day (BID) | INTRAVENOUS | Status: DC
  Administered 2019-10-14 – 2019-10-18 (×9): 400 mg via INTRAVENOUS
  Filled 2019-10-14 (×9): qty 200

## 2019-10-14 MED ORDER — FAMOTIDINE IN NACL 20-0.9 MG/50ML-% IV SOLN
20.0000 mg | Freq: Two times a day (BID) | INTRAVENOUS | Status: DC
  Administered 2019-10-14 – 2019-10-21 (×15): 20 mg via INTRAVENOUS
  Filled 2019-10-14 (×15): qty 50

## 2019-10-14 MED ORDER — MIDAZOLAM HCL 5 MG/5ML IJ SOLN
INTRAMUSCULAR | Status: DC | PRN
  Administered 2019-10-13: 2 mg via INTRAVENOUS

## 2019-10-14 MED ORDER — METRONIDAZOLE IN NACL 5-0.79 MG/ML-% IV SOLN
500.0000 mg | Freq: Three times a day (TID) | INTRAVENOUS | Status: DC
  Administered 2019-10-14 – 2019-10-18 (×13): 500 mg via INTRAVENOUS
  Filled 2019-10-14 (×13): qty 100

## 2019-10-14 MED ORDER — LACTATED RINGERS IV SOLN: INTRAVENOUS | Status: DC | PRN

## 2019-10-14 MED ORDER — GENTAMICIN SULFATE 40 MG/ML IJ SOLN
560.0000 mg | INTRAVENOUS | Status: AC
  Administered 2019-10-14 – 2019-10-18 (×5): 560 mg via INTRAVENOUS
  Filled 2019-10-14 (×6): qty 14

## 2019-10-14 MED ORDER — SODIUM CHLORIDE 0.9 % IV SOLN
8.0000 mg | Freq: Four times a day (QID) | INTRAVENOUS | Status: DC | PRN
  Filled 2019-10-14: qty 4

## 2019-10-14 MED ORDER — METHOCARBAMOL 1000 MG/10ML IJ SOLN
1000.0000 mg | Freq: Four times a day (QID) | INTRAVENOUS | Status: DC | PRN
  Filled 2019-10-14: qty 10

## 2019-10-14 MED ORDER — DEXAMETHASONE SODIUM PHOSPHATE 10 MG/ML IJ SOLN
INTRAMUSCULAR | Status: DC | PRN
  Administered 2019-10-13: 10 mg via INTRAVENOUS

## 2019-10-14 MED ORDER — HYDROMORPHONE HCL 1 MG/ML IJ SOLN
0.5000 mg | INTRAMUSCULAR | Status: DC | PRN
  Administered 2019-10-14 – 2019-10-21 (×8): 1 mg via INTRAVENOUS
  Filled 2019-10-14 (×4): qty 1
  Filled 2019-10-14: qty 2
  Filled 2019-10-14 (×3): qty 1

## 2019-10-14 MED ORDER — SUGAMMADEX SODIUM 200 MG/2ML IV SOLN
INTRAVENOUS | Status: DC | PRN
  Administered 2019-10-14: 200 mg via INTRAVENOUS

## 2019-10-14 MED ORDER — PROPOFOL 10 MG/ML IV BOLUS
INTRAVENOUS | Status: DC | PRN
  Administered 2019-10-13: 120 mg via INTRAVENOUS

## 2019-10-14 MED ORDER — DIPHENHYDRAMINE HCL 50 MG/ML IJ SOLN: 12.5000 mg | Freq: Four times a day (QID) | INTRAMUSCULAR | Status: DC | PRN

## 2019-10-14 MED ORDER — LACTATED RINGERS IV BOLUS: 1000.0000 mL | Freq: Three times a day (TID) | INTRAVENOUS | Status: AC | PRN

## 2019-10-14 MED ORDER — PHENOL 1.4 % MT LIQD: 2.0000 | OROMUCOSAL | Status: DC | PRN

## 2019-10-14 MED ORDER — LACTATED RINGERS IV SOLN: INTRAVENOUS | Status: DC

## 2019-10-14 MED ORDER — PROCHLORPERAZINE MALEATE 10 MG PO TABS
10.0000 mg | ORAL_TABLET | Freq: Four times a day (QID) | ORAL | Status: DC | PRN
  Filled 2019-10-14: qty 1

## 2019-10-14 MED ORDER — MAGIC MOUTHWASH
15.0000 mL | Freq: Four times a day (QID) | ORAL | Status: DC | PRN
  Filled 2019-10-14: qty 15

## 2019-10-14 MED ORDER — STERILE WATER FOR IRRIGATION IR SOLN
Status: DC | PRN
  Administered 2019-10-14: 1000 mL

## 2019-10-14 MED ORDER — DIPHENHYDRAMINE HCL 12.5 MG/5ML PO ELIX: 12.5000 mg | ORAL_SOLUTION | Freq: Four times a day (QID) | ORAL | Status: DC | PRN

## 2019-10-14 MED ORDER — ONDANSETRON HCL 4 MG/2ML IJ SOLN
4.0000 mg | Freq: Four times a day (QID) | INTRAMUSCULAR | Status: DC | PRN
  Administered 2019-10-15 – 2019-10-18 (×2): 4 mg via INTRAVENOUS

## 2019-10-14 MED ORDER — METOPROLOL TARTRATE 5 MG/5ML IV SOLN: 5.0000 mg | Freq: Four times a day (QID) | INTRAVENOUS | Status: DC | PRN

## 2019-10-14 MED ORDER — SUCCINYLCHOLINE CHLORIDE 200 MG/10ML IV SOSY
PREFILLED_SYRINGE | INTRAVENOUS | Status: DC | PRN
  Administered 2019-10-13: 160 mg via INTRAVENOUS

## 2019-10-14 MED ORDER — BUPIVACAINE HCL (PF) 0.25 % IJ SOLN
INTRAMUSCULAR | Status: DC | PRN
  Administered 2019-10-14: 50 mL

## 2019-10-14 MED ORDER — ACETAMINOPHEN 650 MG RE SUPP: 650.0000 mg | Freq: Four times a day (QID) | RECTAL | Status: DC | PRN

## 2019-10-14 MED ORDER — MENTHOL 3 MG MT LOZG
1.0000 | LOZENGE | OROMUCOSAL | Status: DC | PRN
  Administered 2019-10-14: 3 mg via ORAL
  Filled 2019-10-14: qty 9

## 2019-10-14 MED ORDER — SODIUM CHLORIDE 0.9 % IV SOLN: Freq: Three times a day (TID) | INTRAVENOUS | Status: DC | PRN

## 2019-10-14 MED ORDER — LIP MEDEX EX OINT
1.0000 "application " | TOPICAL_OINTMENT | Freq: Two times a day (BID) | CUTANEOUS | Status: DC
  Administered 2019-10-14 – 2019-10-22 (×14): 1 via TOPICAL
  Filled 2019-10-14: qty 7

## 2019-10-14 MED ORDER — ENALAPRILAT 1.25 MG/ML IV SOLN
0.6250 mg | Freq: Four times a day (QID) | INTRAVENOUS | Status: DC | PRN
  Filled 2019-10-14: qty 1

## 2019-10-14 MED ORDER — ONDANSETRON 4 MG PO TBDP: 4.0000 mg | ORAL_TABLET | Freq: Four times a day (QID) | ORAL | Status: DC | PRN

## 2019-10-14 MED ORDER — PROCHLORPERAZINE EDISYLATE 10 MG/2ML IJ SOLN: 5.0000 mg | Freq: Four times a day (QID) | INTRAMUSCULAR | Status: DC | PRN

## 2019-10-14 MED ORDER — BUPIVACAINE LIPOSOME 1.3 % IJ SUSP
INTRAMUSCULAR | Status: DC | PRN
  Administered 2019-10-14: 20 mL

## 2019-10-14 MED ORDER — FENTANYL CITRATE (PF) 100 MCG/2ML IJ SOLN
INTRAMUSCULAR | Status: DC | PRN
  Administered 2019-10-13: 150 ug via INTRAVENOUS
  Administered 2019-10-14: 50 ug via INTRAVENOUS

## 2019-10-14 MED ORDER — ROCURONIUM BROMIDE 10 MG/ML (PF) SYRINGE
PREFILLED_SYRINGE | INTRAVENOUS | Status: DC | PRN
  Administered 2019-10-13: 50 mg via INTRAVENOUS
  Administered 2019-10-14: 20 mg via INTRAVENOUS

## 2019-10-14 MED ORDER — PHENYLEPHRINE 40 MCG/ML (10ML) SYRINGE FOR IV PUSH (FOR BLOOD PRESSURE SUPPORT)
PREFILLED_SYRINGE | INTRAVENOUS | Status: DC | PRN
  Administered 2019-10-13: 80 ug via INTRAVENOUS
  Administered 2019-10-13: 120 ug via INTRAVENOUS

## 2019-10-14 MED ORDER — ONDANSETRON HCL 4 MG/2ML IJ SOLN
4.0000 mg | Freq: Four times a day (QID) | INTRAMUSCULAR | Status: DC | PRN
  Filled 2019-10-14 (×2): qty 2

## 2019-10-14 MED ORDER — ENOXAPARIN SODIUM 40 MG/0.4ML ~~LOC~~ SOLN
40.0000 mg | Freq: Every day | SUBCUTANEOUS | Status: DC
  Administered 2019-10-14 – 2019-10-22 (×9): 40 mg via SUBCUTANEOUS
  Filled 2019-10-14 (×9): qty 0.4

## 2019-10-14 MED ORDER — CHLORHEXIDINE GLUCONATE CLOTH 2 % EX PADS
6.0000 | MEDICATED_PAD | Freq: Every day | CUTANEOUS | Status: DC
  Administered 2019-10-14: 6 via TOPICAL

## 2019-10-14 MED ORDER — LIDOCAINE 2% (20 MG/ML) 5 ML SYRINGE
INTRAMUSCULAR | Status: DC | PRN
  Administered 2019-10-13: 80 mg via INTRAVENOUS

## 2019-10-14 NOTE — Anesthesia Procedure Notes (Signed)
Procedure Name: Intubation Date/Time: 10/13/2019 11:44 PM Performed by: Gerald Leitz, CRNA Pre-anesthesia Checklist: Patient identified, Patient being monitored, Timeout performed, Emergency Drugs available and Suction available Patient Re-evaluated:Patient Re-evaluated prior to induction Oxygen Delivery Method: Circle system utilized Preoxygenation: Pre-oxygenation with 100% oxygen Induction Type: IV induction and Rapid sequence Ventilation: Mask ventilation without difficulty Laryngoscope Size: Mac and 3 Grade View: Grade I Tube type: Oral Tube size: 7.5 mm Number of attempts: 1 Airway Equipment and Method: Stylet Placement Confirmation: ETT inserted through vocal cords under direct vision,  positive ETCO2 and breath sounds checked- equal and bilateral Secured at: 22 cm Tube secured with: Tape Dental Injury: Teeth and Oropharynx as per pre-operative assessment

## 2019-10-14 NOTE — Transfer of Care (Signed)
Immediate Anesthesia Transfer of Care Note  Patient: Stephanie Blanchard  Procedure(s) Performed: Procedure(s): DIAGNOSTIC LAPAROTOMY AND BILATERAL TAP BLOCK REPAIR OF STRANGULATED INCISIONAL HERNIA AND OMENTECTOMY NECROTIC SMALL BOWEL RESECTION APPLICATION OF WOUND VAC  Patient Location: PACU  Anesthesia Type:General  Level of Consciousness: Alert, Awake, Oriented  Airway & Oxygen Therapy: Patient Spontanous Breathing  Post-op Assessment: Report given to RN  Post vital signs: Reviewed and stable  Last Vitals:  Vitals:   10/13/19 2200 10/13/19 2240  BP: (!) 163/85 (!) 138/57  Pulse:  84  Resp:  16  Temp:  37 C  SpO2:  499%    Complications: No apparent anesthesia complications

## 2019-10-14 NOTE — Progress Notes (Signed)
Pharmacy Antibiotic Note  Stephanie Blanchard is a 69 y.o. female admitted on 10/13/2019 with Intra-abdominal infection.  Pharmacy has been consulted for Gentamicin dosing.  Plan: Gentamicin 400mg  (~5mg /kg ABW--MD ordered) iv x1, then will use 7mg /kg iv q24hr---Gentamicin 560mg  iv q24hr Check 10hr level after  Ciprofloxacin 400mg  iv q12hr Flagyl 500mg  iv q8hr   Height: 5\' 2"  (157.5 cm) Weight: 123.8 kg (273 lb) IBW/kg (Calculated) : 50.1  Temp (24hrs), Avg:98.3 F (36.8 C), Min:97.5 F (36.4 C), Max:98.6 F (37 C)  Recent Labs  Lab 10/13/19 1841  WBC 16.5*  CREATININE 0.71    Estimated Creatinine Clearance: 85.7 mL/min (by C-G formula based on SCr of 0.71 mg/dL).    Allergies  Allergen Reactions  . Penicillins     Did it involve swelling of the face/tongue/throat, SOB, or low BP? N Did it involve sudden or severe rash/hives, skin peeling, or any reaction on the inside of your mouth or nose? Y Did you need to seek medical attention at a hospital or doctor's office? N When did it last happen?Several years ago If all above answers are "NO", may proceed with cephalosporin use.   . Red Blood Cells     [employer] Witness--refusal of blood, states she will receive albumin    Antimicrobials this admission: Ciprofloxacin 10/14/2019 >> Flagyl 10/14/2019 >> Gentamicin 10/14/2019 >>  Dose adjustments this admission: -  Microbiology results: -  Thank you for allowing pharmacy to be a part of this patient's care.  Nani Skillern Crowford 10/14/2019 4:52 AM

## 2019-10-14 NOTE — Progress Notes (Addendum)
Pt NGT came out while getting out of the chair. RN made 3 attempts to insert NGT. Pt began gagging and her nose began to bleed. [facility] on-call MD paged.   Coralie Keens return page and advised to leave the NGT out.

## 2019-10-14 NOTE — Op Note (Signed)
10/14/2019  2:02 AM  PATIENT:  Stephanie Blanchard  69 y.o. female  Patient Care Team: System, Pcp Not In as PCP - General  PRE-OPERATIVE DIAGNOSIS: Incarcerated incisional hernia, possible strangulated hernia   POST-OPERATIVE DIAGNOSIS:   Strangulated Incisional Hernia containing necrotic ileum and omentum. Small Bowel Obstruction  PROCEDURE:   REDUCTION & PRIMARY REPAIR OF STRANGULATED INCISIONAL HERNIA SMALL BOWEL RESECTION OMENTECTOMY DIAGNOSTIC LAPAROSCOPY PARTIAL PANNICULECTOMY APPLICATION OF WOUND VAC BILATERAL TAP BLOCK  SURGEON:  Adin Hector, MD  ASSISTANT: OR Staff   ANESTHESIA:     General  Nerve block provided with liposomal bupivacaine (Experel) mixed with 0.25% bupivacaine as a Bilateral TAP block x 78mL each side at the level of the transverse abdominis & preperitoneal spaces along the flank at the anterior axillary line, from subcostal ridge to iliac crest under laparoscopic guidance   Local field block at port sites & extraction wound  EBL:  Total I/O In: 2000 [I.V.:1000; IV Piggyback:1000] Out: 260 [Urine:160; Blood:100]  Delay start of Pharmacological VTE agent (>24hrs) due to surgical blood loss or risk of bleeding:  no  DRAINS: none   SPECIMEN: Necrotic proximal ileum and greater omentum   DISPOSITION OF SPECIMEN:  PATHOLOGY  COUNTS:  YES  PLAN OF CARE: Admit to inpatient   PATIENT DISPOSITION:  PACU - hemodynamically stable.  INDICATION:    Morbidly obese woman visiting from [place].  Has known lower abdominal hernia.  Usually is been reducible.  Came out became stuck for the past 24 hours and worsening nausea vomiting and pain.  CT scan concerning for small bowel contained within a narrow necked incisional hernia.  Large hernia sac.  Not reducible.  I recommended emergent laparoscopic probable open exploration.  Possible small bowel resection.   The anatomy & physiology of the digestive tract was discussed.  The pathophysiology of  perforation was discussed.  Differential diagnosis such as perforated ulcer or colon, etc was discussed.   Natural history risks without surgery such as death was discussed.  I recommended abdominal exploration to diagnose & treat the source of the problem.  Laparoscopic & open techniques were discussed.   Risks such as bleeding, infection, abscess, leak, reoperation, bowel resection, possible ostomy, injury to other organs, need for repair of tissues / organs, hernia, heart attack, death, and other risks were discussed.   The risks of no intervention will lead to serious problems including death.   I expressed a good likelihood that surgery will address the problem.    Goals of post-operative recovery were discussed as well.  We will work to minimize complications although risks in an emergent setting are high.   Questions were answered.  The patient expressed understanding & wishes to proceed with surgery.      The patient expresses understanding & wishes to proceed with surgery.  OR FINDINGS:   Patient had infraumbilical incisional hernia containing a large small for greater omentum as well as a 20 cm of small intestine at the jejunal/ileal junction.  Greater omentum necrotic with foul odor and purulence.  Small bowel black and completely ischemic with early gangrene.  Omentum resected.  Small bowel resection with immediate anastomosis done.  Resulting 6 x 5 cm fascial defect primarily repaired.    Very large hernia sac in central & right panniculus with thinned out poor tissues, necrotic subcutaneous tissues and purulence.  Therefore hernia sac and partial panniculectomy done    CASE DATA:  Type of patient?: LDOW CASE (Surgical Hospitalist [facility] Inpatient)  Status of Case? EMERGENT Add On  Infection Present At Time Of Surgery (PATOS)?  PURULENCE  DESCRIPTION:   Informed consent was confirmed.  The patient underwent general anaesthesia without difficulty.  The patient was positioned  appropriately.  VTE prevention in place.  The patient's abdomen was clipped, prepped, & draped in a sterile fashion.  Surgical timeout confirmed our plan.  Peritoneal entry with a laparoscopic port was obtained using Varess spring needle entry technique in the left upper abdomen as the patient was positioned in reverse Trendelenburg.  I induced carbon dioxide insufflation.  No change in end tidal CO2 measurements.  Full symmetrical abdominal distention.  1st port placed using optical entry technique in the  left upper abdomen.  Entry was clean.  I induced carbon dioxide insufflation.  Camera inspection revealed no injury.  Extra ports were carefully placed under direct laparoscopic visualization.  I could see large swath of greater omentum as well as small bowel going in to the central abdomen anteriorly.  I did lyse adhesions to help come around and identify the area of incarceration.  I transected around the hernia sac and was able to reduce part of the panniculus out.  However as it came more proximally and seemed like the omentum was very ischemic.  However it was get enough to reduce that I could start room reducing some small bowel.  As soon as I did it it was frankly necrotic with some evidence of gangrene.  I therefore did a counterincision through her prior low midline incision encountered a very large hernia sac.  I was able to come around it circumferentially through the some subcutaneous tissues.  I opened the hernia sac with a obvious foul odor with purulence and frank necrotic greater omentum.  Quite a large volume.  This was freed off and sent.  Small bowel was allowed to return to the abdomen and since there was no perforation yet.  Identified and transected most of the hernia sac.  I placed a wound protector.  I brought the small back out and ran it and confirmed the area of frank necrosis and purulence.  Therefore decided to transect this region.  I did a side-to-side staple anastomosis of  distal jejunum to proximal ileum with a 75 GIA stapler.  I transected it off the common staple defect with a TX 90 stapler.  I transected the ischemic/necrotic mesentery with clamp ties and harmonic scalpel.  Specimen removed.  Remaining mesentery was viable.  I did sutures to help laid the small bowel mesentery over the TX staple line to help protect it and close the mesenteric defect transversely.  I had to go through the fascia another centimeter proximally but is able to get this anastomosis reduced back inside.  Clamped to the wound protector and returned to diagnostic laparoscopy.  Found the ileocecal region and ran more proximally till he came to the anastomosis which laid well and was viable.  No evidence of any ischemia or necrosis or active bleeding.  The ileum had been rather decompressed.  Clearly the strangulated incisional hernia was a transition point for her bowel obstruction.  I ran the small bowel proximally.  No evidence of any perforation injury or other abnormality.  There was very little greater omentum left since most of it had been incarcerated hernia.  There were some areas of necrotic omentum infraumbilically going to her enlarged uterus.  This was transected off and removed.  I did copious irrigation of several liters of saline with  clear return.  Tap block had been placed.  Carbon dioxide was evacuated.  Ports were removed.  I ended up transecting the center and right part of the panniculus transversely since her subcutaneous tissues were quite ischemic with patches of necrosis and purulence.  Resulting 20 x 9 x 8 cm deep wound.  Had much healthier bleeding tissue with panniculus.  I reapproximated the fascia transversely with #1 PDS in a running fashion from each corner.  Came together to without much tension.  I closed the 5 mm laparoscopic port sites with 4 Monocryl sterile dressings.  Because there had been purulence necrosis and gangrene, I did not feel was safe to close the  skin.  Therefore I placed a gray sponge medium size wound VAC of the large hernia sac/pannicular wound.  Held suction well.  Patient extubated & in PACU recovery room.  She had had some hypotension at the start of the case but was hemodynamically stable by the end.  Stable now.  Felt by anesthesia & myself safe to go to the floor.  I discussed operative findings, updated the patient's status, discussed probable steps to recovery, and gave postoperative recommendations to the patient's son, Remus Loffler.  Recommendations were made.  Questions were answered.  He expressed understanding & appreciation.  Adin Hector, M.D., F.A.C.S. Gastrointestinal and Minimally Invasive Surgery [facility] [address] [place], [place] [id number] [phone] Main / Paging

## 2019-10-14 NOTE — Progress Notes (Addendum)
[facility] Progress Note  1 Day Post-Op  Subjective: CC:  Denies pain, no bowel function yet. Has not mobilized since surgery. Foley in place.  Objective: Vital signs in last 24 hours: Temp:  [97.5 F (36.4 C)-98.6 F (37 C)] 98.6 F (37 C) (06/04 0700) Pulse Rate:  [72-91] 91 (06/04 0700) Resp:  [15-22] 16 (06/04 0700) BP: (122-184)/(52-85) 154/74 (06/04 0700) SpO2:  [94 %-100 %] 99 % (06/04 0814) Weight:  [123.8 kg] 123.8 kg (06/03 1823)    Intake/Output from previous day: 06/03 0701 - 06/04 0700 In: 2773.5 [I.V.:1517.5; IV Piggyback:1255.9] Out: 600 [Urine:400; Drains:100; Blood:100] Intake/Output this shift: No intake/output data recorded.  PE: Gen:  Alert, NAD, pleasant Card:  Regular rate and rhythm, pedal pulses 2+ BL Pulm:  Normal effort, clear to auscultation bilaterally Abd: Soft, obese, mild tenderness without guarding, non-distended, trochar sites dressed with gauze/tegaderm, panniculectomy site with NPWT in place holding suction. SS drainage in cannister. Skin: warm and dry, no rashes  Psych: A&Ox3   Lab Results:  Recent Labs    10/13/19 1841 10/14/19 0444  WBC 16.5* 19.0*  HGB 14.0 12.8  HCT 42.4 40.5  PLT 246 223   BMET Recent Labs    10/13/19 1841 10/14/19 0444  NA 142 140  K 3.7 3.6  CL 101 103  CO2 29 27  GLUCOSE 138* 143*  BUN 22 20  CREATININE 0.71 0.88  CALCIUM 9.8 8.9   PT/INR No results for input(s): LABPROT, INR in the last 72 hours. CMP     Component Value Date/Time   NA 140 10/14/2019 0444   K 3.6 10/14/2019 0444   CL 103 10/14/2019 0444   CO2 27 10/14/2019 0444   GLUCOSE 143 (H) 10/14/2019 0444   BUN 20 10/14/2019 0444   CREATININE 0.88 10/14/2019 0444   CALCIUM 8.9 10/14/2019 0444   PROT 8.4 (H) 10/13/2019 1841   ALBUMIN 4.0 10/13/2019 1841   AST 23 10/13/2019 1841   ALT 19 10/13/2019 1841   ALKPHOS 23 (L) 10/13/2019 1841   BILITOT 0.7 10/13/2019 1841   GFRNONAA >60 10/14/2019 0444   GFRAA >60  10/14/2019 0444   Lipase     Component Value Date/Time   LIPASE 37 10/13/2019 1841       Studies/Results: X-ray chest PA or AP  Result Date: 10/14/2019 CLINICAL DATA:  Check gastric catheter placement EXAM: CHEST  1 VIEW COMPARISON:  None. FINDINGS: Cardiac shadow is enlarged. Gastric catheter is noted extending into the stomach. No free air is seen. No other focal abnormality is noted. Lungs are clear IMPRESSION: Gastric catheter in the stomach. Electronically Signed   By: Inez Catalina M.D.   On: 10/14/2019 03:34   CT ABDOMEN PELVIS W CONTRAST  Result Date: 10/13/2019 CLINICAL DATA:  Nausea and vomiting.  Right lower quadrant mass. EXAM: CT ABDOMEN AND PELVIS WITH CONTRAST TECHNIQUE: Multidetector CT imaging of the abdomen and pelvis was performed using the standard protocol following bolus administration of intravenous contrast. CONTRAST:  135mL OMNIPAQUE IOHEXOL 300 MG/ML  SOLN COMPARISON:  None. FINDINGS: Lower chest: No acute abnormality. Hepatobiliary: No focal liver abnormality is seen. No gallstones, gallbladder wall thickening, or biliary dilatation. Pancreas: Unremarkable. No pancreatic ductal dilatation or surrounding inflammatory changes. Spleen: Normal in size without focal abnormality. Adrenals/Urinary Tract: The adrenal glands are unremarkable. 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 Subcentimeter low-density lesion in the upper pole of the right kidney is too small to characterize. 1.6 cm simple  cyst in the left kidney. Tiny nonobstructive calculi in the lower poles of both kidneys. No hydronephrosis. The bladder is unremarkable. Stomach/Bowel: Large right infraumbilical hernia containing fat and a single loop of mildly dilated small bowel with trace ascites and mild surrounding inflammatory changes. No wall thickening or pneumatosis. Mild dilatation of the small bowel immediately proximal to the hernia. Mild left-sided colonic diverticulosis. The stomach is  within normal limits. Normal appendix. Vascular/Lymphatic: Aortic atherosclerosis. No enlarged abdominal or pelvic lymph nodes. Reproductive: Uterus and bilateral adnexa are unremarkable. Other: No free fluid or pneumoperitoneum. Musculoskeletal: No acute or significant osseous findings. IMPRESSION: 1. Large right infraumbilical hernia containing fat and a single loop of mildly dilated small bowel with trace ascites and mild surrounding inflammatory changes, concerning for strangulation. Mild dilatation of the small bowel immediately proximal to the hernia, consistent with early obstruction. 2. Bilateral nonobstructive nephrolithiasis. 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 4. Aortic Atherosclerosis (ICD10-I70.0). Electronically Signed   By: Titus Dubin M.D.   On: 10/13/2019 21:04    Anti-infectives: Anti-infectives (From admission, onward)   Start     Dose/Rate Route Frequency Ordered Stop   10/14/19 1800  gentamicin (GARAMYCIN) 560 mg in dextrose 5 % 100 mL IVPB     560 mg 114 mL/hr over 60 Minutes Intravenous Every 24 hours 10/14/19 0449     10/14/19 0600  clindamycin (CLEOCIN) IVPB 900 mg     900 mg 100 mL/hr over 30 Minutes Intravenous On call to O.R. 10/13/19 2226 10/13/19 2338   10/14/19 0600  gentamicin (GARAMYCIN) 400 mg in dextrose 5 % 100 mL IVPB     400 mg 110 mL/hr over 60 Minutes Intravenous On call to O.R. 10/13/19 2226 10/13/19 2338   10/14/19 0215  ciprofloxacin (CIPRO) IVPB 400 mg     400 mg 200 mL/hr over 60 Minutes Intravenous 2 times daily 10/14/19 0202     10/14/19 0215  metroNIDAZOLE (FLAGYL) IVPB 500 mg     500 mg 100 mL/hr over 60 Minutes Intravenous Every 8 hours 10/14/19 0202     10/13/19 2314  clindamycin (CLEOCIN) 900 MG/50ML IVPB    Note to Pharmacy: Eliberto Ivory   : cabinet override      10/13/19 2314 10/14/19 0220     Assessment/Plan Strangulated Incisional Hernia containing necrotic ileum and omentum. Small Bowel Obstruction  S/p reduction and  pimary repair of incisional hernia, SBR, omentectomy, partial panniculectomy, application of wound vac 10/14/19 Dr. Johney Maine POD#0, afebrile ,VSS Await return of bowel function  PRN analgesics (robaxin, morphine)  OOB today, PT/OT evals  NPWT HH orders placed   FEN: NPO, IVF ID: cipro/flagyl 6/4, gentamycin 6/4 >> plan to D/C POD#4  VTE: SCD's, lovenox  Foley: placed 6/4, plan to D/C tomorrow POD#1    LOS: 0 days    Obie Dredge, [facility] Please see Amion for pager number during day hours 7:00am-4:30pm

## 2019-10-15 NOTE — Evaluation (Signed)
Physical Therapy Evaluation Patient Details Name: Stephanie Blanchard MRN: [id number] DOB: [birth year] Today's Date: 10/15/2019   History of Present Illness  69 yo female s/p lap SB resection, hernia repair, omentectomy, wound vac placement 10/14/19.  Clinical Impression  On eval, pt was Supv-Min guard assist for mobility. She walked ~100 feet around the unit with a RW this session. Ambulation distance limited 2* IV pole battery requiring charging. Pt reported she has been trying to walk around entire unit 2x/day. Anticipate pt will continue to progress well. Will follow during hospital stay.     Follow Up Recommendations No PT follow up;Supervision for mobility/OOB    Equipment Recommendations  Rolling walker with 5" wheels(depending on continued progress/may not need at d/c)    Recommendations for Other Services OT consult     Precautions / Restrictions Precautions Precaution Comments: abominal incision, wound vac Restrictions Weight Bearing Restrictions: No      Mobility  Bed Mobility Overal bed mobility: Needs Assistance Bed Mobility: Supine to Sit;Sit to Supine     Supine to sit: Min guard Sit to supine: Min guard   General bed mobility comments: Pt does a partial roll then uses bedrail. Increased time. Some effort noted with getting LEs off/onto bed.  Transfers Overall transfer level: Needs assistance   Transfers: Sit to/from Stand Sit to Stand: Supervision         General transfer comment: for safety. Cues for hand placement.  Ambulation/Gait Ambulation/Gait assistance: Supervision Gait Distance (Feet): 100 Feet Assistive device: Rolling walker (2 wheeled) Gait Pattern/deviations: Step-through pattern;Decreased stride length     General Gait Details: for safety.  Stairs            Wheelchair Mobility    Modified Rankin (Stroke Patients Only)       Balance Overall balance assessment: Mild deficits observed, not formally tested                                            Pertinent Vitals/Pain Pain Assessment: Faces Faces Pain Scale: Hurts little more Pain Location: abdomen Pain Descriptors / Indicators: Discomfort;Sore Pain Intervention(s): Monitored during session;Repositioned    Home Living Family/patient expects to be discharged to:: Private residence Living Arrangements: Other relatives Available Help at Discharge: Family Type of Home: House Home Access: Level entry     Home Layout: Multi-level Home Equipment: Shower seat;Toilet riser;Cane - single point Additional Comments: bedroom and bathroom she uses will be downstairs.    Prior Function Level of Independence: Independent with assistive device(s)   Gait / Transfers Assistance Needed: uses cane PRN           Hand Dominance        Extremity/Trunk Assessment   Upper Extremity Assessment Upper Extremity Assessment: Defer to OT evaluation    Lower Extremity Assessment Lower Extremity Assessment: Overall WFL for tasks assessed    Cervical / Trunk Assessment Cervical / Trunk Assessment: Normal  Communication   Communication: No difficulties  Cognition Arousal/Alertness: Awake/alert Behavior During Therapy: WFL for tasks assessed/performed Overall Cognitive Status: Within Functional Limits for tasks assessed                                        General Comments      Exercises     Assessment/Plan  PT Assessment Patient needs continued PT services  PT Problem List Decreased mobility;Pain;Obesity;Decreased activity tolerance;Decreased knowledge of use of DME       PT Treatment Interventions DME instruction;Gait training;Therapeutic activities;Therapeutic exercise;Patient/family education;Balance training;Functional mobility training    PT Goals (Current goals can be found in the Care Plan section)  Acute Rehab PT Goals Patient Stated Goal: maintain independence PT Goal Formulation: With  patient Time For Goal Achievement: 10/29/19 Potential to Achieve Goals: Good    Frequency Min 3X/week   Barriers to discharge        Co-evaluation               AM-PAC PT "6 Clicks" Mobility  Outcome Measure Help needed turning from your back to your side while in a flat bed without using bedrails?: A Little Help needed moving from lying on your back to sitting on the side of a flat bed without using bedrails?: A Little Help needed moving to and from a bed to a chair (including a wheelchair)?: A Little Help needed standing up from a chair using your arms (e.g., wheelchair or bedside chair)?: A Little Help needed to walk in hospital room?: A Little Help needed climbing 3-5 steps with a railing? : A Little 6 Click Score: 18    End of Session   Activity Tolerance: Patient tolerated treatment well Patient left: in bed;with call bell/phone within reach   PT Visit Diagnosis: Pain;Other abnormalities of gait and mobility (R26.89) Pain - part of body: (abdomen)    Time: 2841-3244 PT Time Calculation (min) (ACUTE ONLY): 17 min   Charges:   PT Evaluation $PT Eval Low Complexity: 1 Low             Camerin Ladouceur P, PT Acute Rehabilitation

## 2019-10-15 NOTE — Plan of Care (Signed)
  Problem: Education: Goal: Knowledge of General Education information will improve Description: Including pain rating scale, medication(s)/side effects and non-pharmacologic comfort measures Outcome: Progressing   Problem: Pain Managment: Goal: General experience of comfort will improve Outcome: Progressing   

## 2019-10-15 NOTE — Progress Notes (Signed)
PT Cancellation Note  Patient Details Name: Stephanie Blanchard MRN: [id number] DOB: 1950/07/17   Cancelled Treatment:    Reason Eval/Treat Not Completed: Attempted PT eval-pt declined to participate at this time. Will check back later today.   Doreatha Massed, PT Acute Rehabilitation

## 2019-10-15 NOTE — Progress Notes (Signed)
2 Days Post-Op   Subjective/Chief Complaint: Nausea this morning and belching No flatus Foley removed   Objective: Vital signs in last 24 hours: Temp:  [98.3 F (36.8 C)-99.8 F (37.7 C)] 98.3 F (36.8 C) (06/05 0601) Pulse Rate:  [81-97] 81 (06/05 0601) Resp:  [16-19] 18 (06/05 0601) BP: (122-148)/(56-68) 122/56 (06/05 0601) SpO2:  [97 %-100 %] 98 % (06/05 0601)    Intake/Output from previous day: 06/04 0701 - 06/05 0700 In: 2610.7 [P.O.:215; I.V.:1741.6; IV Piggyback:654.1] Out: 910 [Urine:800; Drains:110] Intake/Output this shift: No intake/output data recorded.  Exam: Up in a chair, awake and alert Abdomen obese, soft, VAC in place  Lab Results:  Recent Labs    10/13/19 1841 10/14/19 0444  WBC 16.5* 19.0*  HGB 14.0 12.8  HCT 42.4 40.5  PLT 246 223   BMET Recent Labs    10/13/19 1841 10/14/19 0444  NA 142 140  K 3.7 3.6  CL 101 103  CO2 29 27  GLUCOSE 138* 143*  BUN 22 20  CREATININE 0.71 0.88  CALCIUM 9.8 8.9   PT/INR No results for input(s): LABPROT, INR in the last 72 hours. ABG No results for input(s): PHART, HCO3 in the last 72 hours.  Invalid input(s): PCO2, PO2  Studies/Results: X-ray chest PA or AP  Result Date: 10/14/2019 CLINICAL DATA:  Check gastric catheter placement EXAM: CHEST  1 VIEW COMPARISON:  None. FINDINGS: Cardiac shadow is enlarged. Gastric catheter is noted extending into the stomach. No free air is seen. No other focal abnormality is noted. Lungs are clear IMPRESSION: Gastric catheter in the stomach. Electronically Signed   By: Inez Catalina M.D.   On: 10/14/2019 03:34   CT ABDOMEN PELVIS W CONTRAST  Result Date: 10/13/2019 CLINICAL DATA:  Nausea and vomiting.  Right lower quadrant mass. EXAM: CT ABDOMEN AND PELVIS WITH CONTRAST TECHNIQUE: Multidetector CT imaging of the abdomen and pelvis was performed using the standard protocol following bolus administration of intravenous contrast. CONTRAST:  162mL OMNIPAQUE IOHEXOL 300  MG/ML  SOLN COMPARISON:  None. FINDINGS: Lower chest: No acute abnormality. Hepatobiliary: No focal liver abnormality is seen. No gallstones, gallbladder wall thickening, or biliary dilatation. Pancreas: Unremarkable. No pancreatic ductal dilatation or surrounding inflammatory changes. Spleen: Normal in size without focal abnormality. Adrenals/Urinary Tract: The adrenal glands are unremarkable. 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 Subcentimeter low-density lesion in the upper pole of the right kidney is too small to characterize. 1.6 cm simple cyst in the left kidney. Tiny nonobstructive calculi in the lower poles of both kidneys. No hydronephrosis. The bladder is unremarkable. Stomach/Bowel: Large right infraumbilical hernia containing fat and a single loop of mildly dilated small bowel with trace ascites and mild surrounding inflammatory changes. No wall thickening or pneumatosis. Mild dilatation of the small bowel immediately proximal to the hernia. Mild left-sided colonic diverticulosis. The stomach is within normal limits. Normal appendix. Vascular/Lymphatic: Aortic atherosclerosis. No enlarged abdominal or pelvic lymph nodes. Reproductive: Uterus and bilateral adnexa are unremarkable. Other: No free fluid or pneumoperitoneum. Musculoskeletal: No acute or significant osseous findings. IMPRESSION: 1. Large right infraumbilical hernia containing fat and a single loop of mildly dilated small bowel with trace ascites and mild surrounding inflammatory changes, concerning for strangulation. Mild dilatation of the small bowel immediately proximal to the hernia, consistent with early obstruction. 2. Bilateral nonobstructive nephrolithiasis. 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 4. Aortic Atherosclerosis (ICD10-I70.0). Electronically Signed   By: Titus Dubin M.D.   On: 10/13/2019 21:04  Anti-infectives: Anti-infectives (From admission, onward)   Start     Dose/Rate Route Frequency  Ordered Stop   10/14/19 1800  gentamicin (GARAMYCIN) 560 mg in dextrose 5 % 100 mL IVPB     560 mg 114 mL/hr over 60 Minutes Intravenous Every 24 hours 10/14/19 0449     10/14/19 0600  clindamycin (CLEOCIN) IVPB 900 mg     900 mg 100 mL/hr over 30 Minutes Intravenous On call to O.R. 10/13/19 2226 10/13/19 2338   10/14/19 0600  gentamicin (GARAMYCIN) 400 mg in dextrose 5 % 100 mL IVPB     400 mg 110 mL/hr over 60 Minutes Intravenous On call to O.R. 10/13/19 2226 10/13/19 2338   10/14/19 0215  ciprofloxacin (CIPRO) IVPB 400 mg     400 mg 200 mL/hr over 60 Minutes Intravenous 2 times daily 10/14/19 0202     10/14/19 0215  metroNIDAZOLE (FLAGYL) IVPB 500 mg     500 mg 100 mL/hr over 60 Minutes Intravenous Every 8 hours 10/14/19 0202     10/13/19 2314  clindamycin (CLEOCIN) 900 MG/50ML IVPB    Note to Pharmacy: Eliberto Ivory   : cabinet override      10/13/19 2314 10/14/19 0220      Assessment/Plan: s/p Procedure(s): DIAGNOSTIC LAPAROTOMY AND BILATERAL TAP BLOCK REPAIR OF STRANGULATED INCISIONAL HERNIA AND OMENTECTOMY NECROTIC SMALL BOWEL RESECTION APPLICATION OF WOUND VAC  Continue NPO until signs of return of bowel function NG pulled out last night and nurses unsuccessful trying to replace. If emesis develops, will need to try NG again   LOS: 1 day    Stephanie Blanchard 10/15/2019

## 2019-10-15 NOTE — Evaluation (Signed)
Occupational Therapy Evaluation Patient Details Name: Stephanie Blanchard MRN: [id number] DOB: Mar 08, 1951 Today's Date: 10/15/2019    History of Present Illness Stephanie Blanchard  has presented today for surgery, with the diagnosis of hernia. S/p DIAGNOSTIC LAPAROTOMY AND BILATERAL TAP BLOCKREPAIR OF STRANGULATED INCISIONAL HERNIA AND OMENTECTOMYNECROTIC SMALL BOWEL RESECTIONAPPLICATION OF WOUND VAC   Clinical Impression   Stephanie Blanchard is a 69 year old woman s/p laparatomy of strangulated incisional hernia and small bowel resection with placement of wound vac. On evaluation patient demonstrated ability to perform limited mobility in room with RW,  Use of bed rails for bed transfer and min assist and set up needed for most ADLs. Patient will benefit from skilled OT services while in hospital order to return home at independence.     Follow Up Recommendations  No OT follow up    Equipment Recommendations  None recommended by OT    Recommendations for Other Services       Precautions / Restrictions Precautions Precaution Comments: abominal incision, wound vac Restrictions Weight Bearing Restrictions: No      Mobility Bed Mobility Overal bed mobility: Modified Independent             General bed mobility comments: used bed rails to transfer to side of bed. verbal cue for log roll.  Transfers Overall transfer level: Needs assistance               General transfer comment: min guard with RW to ambulate.    Balance Overall balance assessment: No apparent balance deficits (not formally assessed)                                         ADL either performed or assessed with clinical judgement   ADL Overall ADL's : Needs assistance/impaired Eating/Feeding: Independent   Grooming: Standing;Wash/dry face;Oral care Grooming Details (indicate cue type and reason): patient performed oral care standing at sink Upper Body Bathing: Minimal  assistance;Sitting;Set up   Lower Body Bathing: Minimal assistance;Sit to/from stand;Set up   Upper Body Dressing : Set up;Sitting   Lower Body Dressing: Minimal assistance;Set up;Sit to/from stand Lower Body Dressing Details (indicate cue type and reason): min assist to donn slippers. Demonstrated ability to donn pseudo underwear. Patient reports having a shoe horn and long handled reacher at home. Reports being able to get leg up on bed to donn socks at baseline. Toilet Transfer: Min guard;Ambulation;RW;Regular Museum/gallery exhibitions officer and Hygiene: Sit to/from stand;Min guard   Tub/ Shower Transfer: Min guard   Functional mobility during ADLs: Min guard;Rolling walker       Vision   Vision Assessment?: No apparent visual deficits     Perception     Praxis      Pertinent Vitals/Pain Pain Assessment: 0-10 Pain Score: 2  Pain Location: bottom of stomach Pain Descriptors / Indicators: Sore     Hand Dominance Right   Extremity/Trunk Assessment Upper Extremity Assessment Upper Extremity Assessment: Overall WFL for tasks assessed   Lower Extremity Assessment Lower Extremity Assessment: Defer to PT evaluation   Cervical / Trunk Assessment Cervical / Trunk Assessment: Normal   Communication Communication Communication: No difficulties   Cognition Arousal/Alertness: Awake/alert Behavior During Therapy: WFL for tasks assessed/performed Overall Cognitive Status: Within Functional Limits for tasks assessed  General Comments       Exercises     Shoulder Instructions      Home Living Family/patient expects to be discharged to:: Private residence Living Arrangements: Other relatives Available Help at Discharge: Family Type of Home: House Home Access: Level entry     Home Layout: Multi-level Alternate Level Stairs-Number of Steps: 12   Bathroom Shower/Tub: Occupational psychologist:  Standard Bathroom Accessibility: Yes   Home Equipment: Civil engineer, contracting;Toilet riser   Additional Comments: bedroom and bathroom she uses is downstairs.      Prior Functioning/Environment Level of Independence: Independent with assistive device(s)        Comments: Has a long handled reacher and shoe horn.        OT Problem List: Decreased activity tolerance;Pain      OT Treatment/Interventions: Self-care/ADL training;DME and/or AE instruction;Therapeutic activities;Patient/family education    OT Goals(Current goals can be found in the care plan section) Acute Rehab OT Goals Patient Stated Goal: maintain independence OT Goal Formulation: With patient Time For Goal Achievement: 10/29/19 Potential to Achieve Goals: Good  OT Frequency: Min 2X/week   Barriers to D/C:            Co-evaluation              AM-PAC OT "6 Clicks" Daily Activity     Outcome Measure Help from another person eating meals?: None Help from another person taking care of personal grooming?: None Help from another person toileting, which includes using toliet, bedpan, or urinal?: A Little Help from another person bathing (including washing, rinsing, drying)?: A Little Help from another person to put on and taking off regular upper body clothing?: None Help from another person to put on and taking off regular lower body clothing?: A Little 6 Click Score: 21   End of Session Equipment Utilized During Treatment: Rolling walker Nurse Communication: Mobility status  Activity Tolerance: Patient tolerated treatment well Patient left: in chair;with call bell/phone within reach;Other (comment)(legs elevated.)  OT Visit Diagnosis: Pain                Time: 0850-0909 OT Time Calculation (min): 19 min Charges:  OT General Charges $OT Visit: 1 Visit OT Evaluation $OT Eval Low Complexity: 1 Low  Stephanie Blanchard, OTR/L [place]  Office [phone]   Lenward Chancellor 10/15/2019, 9:41 AM

## 2019-10-16 LAB — CBC
HCT: 33.5 % — ABNORMAL LOW (ref 36.0–46.0)
Hemoglobin: 10.8 g/dL — ABNORMAL LOW (ref 12.0–15.0)
MCH: 29.9 pg (ref 26.0–34.0)
MCHC: 32.2 g/dL (ref 30.0–36.0)
MCV: 92.8 fL (ref 80.0–100.0)
Platelets: 188 10*3/uL (ref 150–400)
RBC: 3.61 MIL/uL — ABNORMAL LOW (ref 3.87–5.11)
RDW: 13.2 % (ref 11.5–15.5)
WBC: 14.3 10*3/uL — ABNORMAL HIGH (ref 4.0–10.5)
nRBC: 0 % (ref 0.0–0.2)

## 2019-10-16 LAB — BASIC METABOLIC PANEL
Anion gap: 9 (ref 5–15)
BUN: 20 mg/dL (ref 8–23)
CO2: 27 mmol/L (ref 22–32)
Calcium: 8.9 mg/dL (ref 8.9–10.3)
Chloride: 103 mmol/L (ref 98–111)
Creatinine, Ser: 0.63 mg/dL (ref 0.44–1.00)
GFR calc Af Amer: >60 mL/min (ref >60)
GFR calc non Af Amer: >60 mL/min (ref >60)
Glucose, Bld: 108 mg/dL — ABNORMAL HIGH (ref 70–99)
Potassium: 3.4 mmol/L — ABNORMAL LOW (ref 3.5–5.1)
Sodium: 139 mmol/L (ref 135–145)

## 2019-10-16 MED ORDER — SODIUM CHLORIDE 0.9 % IV SOLN: INTRAVENOUS | Status: AC | PRN

## 2019-10-16 NOTE — Progress Notes (Signed)
3 Days Post-Op   Subjective/Chief Complaint: Small episode of emesis yesterday Still nauseated today but now passing flatus   Objective: Vital signs in last 24 hours: Temp:  [97.9 F (36.6 C)-98.5 F (36.9 C)] 98.3 F (36.8 C) (06/06 0526) Pulse Rate:  [75-83] 75 (06/06 0526) Resp:  [13-20] 13 (06/06 0526) BP: (138-156)/(59-81) 138/59 (06/06 0526) SpO2:  [96 %-99 %] 99 % (06/06 0526) Last BM Date: 10/11/19  Intake/Output from previous day: 06/05 0701 - 06/06 0700 In: 3140 [P.O.:180; I.V.:1931.6; IV Piggyback:1028.4] Out: 350 [Urine:275; Emesis/NG output:75] Intake/Output this shift: No intake/output data recorded.  Exam: Awake and alert Abdomen obese, softer, VAC in place  Lab Results:  Recent Labs    10/14/19 0444 10/16/19 0411  WBC 19.0* 14.3*  HGB 12.8 10.8*  HCT 40.5 33.5*  PLT 223 188   BMET Recent Labs    10/14/19 0444 10/16/19 0411  NA 140 139  K 3.6 3.4*  CL 103 103  CO2 27 27  GLUCOSE 143* 108*  BUN 20 20  CREATININE 0.88 0.63  CALCIUM 8.9 8.9   PT/INR No results for input(s): LABPROT, INR in the last 72 hours. ABG No results for input(s): PHART, HCO3 in the last 72 hours.  Invalid input(s): PCO2, PO2  Studies/Results: No results found.  Anti-infectives: Anti-infectives (From admission, onward)   Start     Dose/Rate Route Frequency Ordered Stop   10/14/19 1800  gentamicin (GARAMYCIN) 560 mg in dextrose 5 % 100 mL IVPB     560 mg 114 mL/hr over 60 Minutes Intravenous Every 24 hours 10/14/19 0449     10/14/19 0600  clindamycin (CLEOCIN) IVPB 900 mg     900 mg 100 mL/hr over 30 Minutes Intravenous On call to O.R. 10/13/19 2226 10/13/19 2338   10/14/19 0600  gentamicin (GARAMYCIN) 400 mg in dextrose 5 % 100 mL IVPB     400 mg 110 mL/hr over 60 Minutes Intravenous On call to O.R. 10/13/19 2226 10/13/19 2338   10/14/19 0215  ciprofloxacin (CIPRO) IVPB 400 mg     400 mg 200 mL/hr over 60 Minutes Intravenous 2 times daily 10/14/19 0202      10/14/19 0215  metroNIDAZOLE (FLAGYL) IVPB 500 mg     500 mg 100 mL/hr over 60 Minutes Intravenous Every 8 hours 10/14/19 0202     10/13/19 2314  clindamycin (CLEOCIN) 900 MG/50ML IVPB    Note to Pharmacy: Eliberto Ivory   : cabinet override      10/13/19 2314 10/14/19 0220      Assessment/Plan: s/p Procedure(s): DIAGNOSTIC LAPAROTOMY AND BILATERAL TAP BLOCK REPAIR OF STRANGULATED INCISIONAL HERNIA AND OMENTECTOMY NECROTIC SMALL BOWEL RESECTION APPLICATION OF WOUND VAC  Continue NPO and IVF/Abx WBC improved Ambulate Wound care  LOS: 2 days    Coralie Keens 10/16/2019

## 2019-10-16 NOTE — Plan of Care (Signed)
  Problem: Education: Goal: Knowledge of General Education information will improve Description: Including pain rating scale, medication(s)/side effects and non-pharmacologic comfort measures 10/16/2019 0116 by Blase Mess, RN Outcome: Progressing 10/16/2019 0116 by Blase Mess, RN Outcome: Progressing   Problem: Health Behavior/Discharge Planning: Goal: Ability to manage health-related needs will improve 10/16/2019 0116 by Blase Mess, RN Outcome: Progressing 10/16/2019 0116 by Blase Mess, RN Outcome: Progressing   Problem: Clinical Measurements: Goal: Ability to maintain clinical measurements within normal limits will improve 10/16/2019 0116 by Blase Mess, RN Outcome: Progressing 10/16/2019 0116 by Blase Mess, RN Outcome: Progressing Goal: Will remain free from infection 10/16/2019 0116 by Blase Mess, RN Outcome: Progressing 10/16/2019 0116 by Blase Mess, RN Outcome: Progressing Goal: Diagnostic test results will improve 10/16/2019 0116 by Blase Mess, RN Outcome: Progressing 10/16/2019 0116 by Blase Mess, RN Outcome: Progressing Goal: Respiratory complications will improve 10/16/2019 0116 by Blase Mess, RN Outcome: Progressing 10/16/2019 0116 by Blase Mess, RN Outcome: Progressing Goal: Cardiovascular complication will be avoided 10/16/2019 0116 by Blase Mess, RN Outcome: Progressing 10/16/2019 0116 by Blase Mess, RN Outcome: Progressing   Problem: Activity: Goal: Risk for activity intolerance will decrease 10/16/2019 0116 by Blase Mess, RN Outcome: Progressing 10/16/2019 0116 by Blase Mess, RN Outcome: Progressing   Problem: Nutrition: Goal: Adequate nutrition will be maintained 10/16/2019 0116 by Blase Mess, RN Outcome: Progressing 10/16/2019 0116 by Blase Mess, RN Outcome: Progressing   Problem: Coping: Goal: Level of anxiety will  decrease 10/16/2019 0116 by Blase Mess, RN Outcome: Progressing 10/16/2019 0116 by Blase Mess, RN Outcome: Progressing   Problem: Elimination: Goal: Will not experience complications related to bowel motility 10/16/2019 0116 by Blase Mess, RN Outcome: Progressing 10/16/2019 0116 by Blase Mess, RN Outcome: Progressing Goal: Will not experience complications related to urinary retention 10/16/2019 0116 by Blase Mess, RN Outcome: Progressing 10/16/2019 0116 by Blase Mess, RN Outcome: Progressing   Problem: Pain Managment: Goal: General experience of comfort will improve 10/16/2019 0116 by Blase Mess, RN Outcome: Progressing 10/16/2019 0116 by Blase Mess, RN Outcome: Progressing   Problem: Safety: Goal: Ability to remain free from injury will improve 10/16/2019 0116 by Blase Mess, RN Outcome: Progressing 10/16/2019 0116 by Blase Mess, RN Outcome: Progressing   Problem: Skin Integrity: Goal: Risk for impaired skin integrity will decrease 10/16/2019 0116 by Blase Mess, RN Outcome: Progressing 10/16/2019 0116 by Blase Mess, RN Outcome: Progressing   Problem: Education: Goal: Required Educational Video(s) 10/16/2019 0116 by Blase Mess, RN Outcome: Progressing 10/16/2019 0116 by Blase Mess, RN Outcome: Progressing   Problem: Clinical Measurements: Goal: Ability to maintain clinical measurements within normal limits will improve 10/16/2019 0116 by Blase Mess, RN Outcome: Progressing 10/16/2019 0116 by Blase Mess, RN Outcome: Progressing Goal: Postoperative complications will be avoided or minimized 10/16/2019 0116 by Blase Mess, RN Outcome: Progressing 10/16/2019 0116 by Blase Mess, RN Outcome: Progressing   Problem: Skin Integrity: Goal: Demonstration of wound healing without infection will improve 10/16/2019 0116 by Blase Mess,  RN Outcome: Progressing 10/16/2019 0116 by Blase Mess, RN Outcome: Progressing

## 2019-10-17 LAB — BASIC METABOLIC PANEL
Anion gap: 11 (ref 5–15)
BUN: 20 mg/dL (ref 8–23)
CO2: 28 mmol/L (ref 22–32)
Calcium: 9 mg/dL (ref 8.9–10.3)
Chloride: 100 mmol/L (ref 98–111)
Creatinine, Ser: 0.66 mg/dL (ref 0.44–1.00)
GFR calc Af Amer: >60 mL/min (ref >60)
GFR calc non Af Amer: >60 mL/min (ref >60)
Glucose, Bld: 109 mg/dL — ABNORMAL HIGH (ref 70–99)
Potassium: 3.5 mmol/L (ref 3.5–5.1)
Sodium: 139 mmol/L (ref 135–145)

## 2019-10-17 LAB — CBC
HCT: 35.9 % — ABNORMAL LOW (ref 36.0–46.0)
Hemoglobin: 11.7 g/dL — ABNORMAL LOW (ref 12.0–15.0)
MCH: 30 pg (ref 26.0–34.0)
MCHC: 32.6 g/dL (ref 30.0–36.0)
MCV: 92.1 fL (ref 80.0–100.0)
Platelets: 228 10*3/uL (ref 150–400)
RBC: 3.9 MIL/uL (ref 3.87–5.11)
RDW: 13.1 % (ref 11.5–15.5)
WBC: 12.4 10*3/uL — ABNORMAL HIGH (ref 4.0–10.5)
nRBC: 0 % (ref 0.0–0.2)

## 2019-10-17 LAB — MAGNESIUM: Magnesium: 2 mg/dL (ref 1.7–2.4)

## 2019-10-17 LAB — SURGICAL PATHOLOGY

## 2019-10-17 MED ORDER — POTASSIUM CHLORIDE CRYS ER 20 MEQ PO TBCR
40.0000 meq | EXTENDED_RELEASE_TABLET | Freq: Once | ORAL | Status: AC
  Administered 2019-10-17: 40 meq via ORAL
  Filled 2019-10-17: qty 2

## 2019-10-17 MED ORDER — POTASSIUM CHLORIDE 10 MEQ/100ML IV SOLN: 10.0000 meq | INTRAVENOUS | Status: DC

## 2019-10-17 MED ORDER — ACETAMINOPHEN 325 MG PO TABS
650.0000 mg | ORAL_TABLET | Freq: Four times a day (QID) | ORAL | Status: DC
  Administered 2019-10-17 – 2019-10-22 (×16): 650 mg via ORAL
  Filled 2019-10-17 (×21): qty 2

## 2019-10-17 MED ORDER — PROCHLORPERAZINE MALEATE 5 MG PO TABS
5.0000 mg | ORAL_TABLET | Freq: Four times a day (QID) | ORAL | Status: AC
  Administered 2019-10-17 – 2019-10-18 (×4): 5 mg via ORAL
  Filled 2019-10-17 (×4): qty 1

## 2019-10-17 MED ORDER — POTASSIUM CHLORIDE 10 MEQ/100ML IV SOLN
10.0000 meq | INTRAVENOUS | Status: AC
  Administered 2019-10-17 (×4): 10 meq via INTRAVENOUS
  Filled 2019-10-17 (×4): qty 100

## 2019-10-17 NOTE — Care Management Important Message (Signed)
Important Message  Patient Details  Name: Stephanie Blanchard MRN: [id number] Date of Birth: 1951-02-17   Medicare Important Message Given:     Document was given to Lennart Pall  to give to the patient.    Crista Luria 10/17/2019, 10:37 AM

## 2019-10-17 NOTE — Plan of Care (Signed)

## 2019-10-17 NOTE — Consult Note (Addendum)
[facility] Nurse Consult Note: Reason for Consult: Consult requested to change Vac dressing; pt is being followed by the surgical team for plan of care. Wound type: Full thickness lower abd post-op wound; 100% beefy red, 6X26X8cm, some old clotted blood to outer wound edges.  No active bleeding.  Small amt pink drainage in the tubing.  Pt was medicated for pain prior to the procedure and tolerated with mod amt discomfort.  One piece black foam inserted to 150mm cont suction. WOC will plan to change dressing Q M/W/F while patient is in the hospital. Julien Girt MSN, RN, [place], [place], [place]

## 2019-10-17 NOTE — Progress Notes (Signed)
Physical Therapy Treatment Patient Details Name: Stephanie Blanchard MRN: [id number] DOB: 11/19/1950 Today's Date: 10/17/2019    History of Present Illness 69 yo female s/p lap SB resection, hernia repair, omentectomy, wound vac placement 10/14/19.    PT Comments    Progressing with mobility.    Follow Up Recommendations  No PT follow up;Supervision for mobility/OOB     Equipment Recommendations  Rolling walker with 5" wheels    Recommendations for Other Services       Precautions / Restrictions Precautions Precaution Comments: abominal incision, wound vac Restrictions Weight Bearing Restrictions: No    Mobility  Bed Mobility Overal bed mobility: Needs Assistance Bed Mobility: Supine to Sit     Supine to sit: Supervision;HOB elevated     General bed mobility comments: [place]iance on bedrail. Supv for lines.  Transfers Overall transfer level: Needs assistance Equipment used: Rolling walker (2 wheeled) Transfers: Sit to/from Stand Sit to Stand: Supervision         General transfer comment: for safety. Cues for hand placement.  Ambulation/Gait Ambulation/Gait assistance: Supervision Gait Distance (Feet): 1000 Feet Assistive device: Rolling walker (2 wheeled) Gait Pattern/deviations: Step-through pattern;Decreased stride length     General Gait Details: for safety.   Stairs             Wheelchair Mobility    Modified Rankin (Stroke Patients Only)       Balance Overall balance assessment: Mild deficits observed, not formally tested                                          Cognition Arousal/Alertness: Awake/alert Behavior During Therapy: WFL for tasks assessed/performed Overall Cognitive Status: Within Functional Limits for tasks assessed                                        Exercises      General Comments        Pertinent Vitals/Pain Pain Assessment: No/denies pain Faces Pain Scale: Hurts even  more Pain Location: abdomen Pain Descriptors / Indicators: Discomfort;Sore;Tender Pain Intervention(s): Monitored during session;Repositioned    Home Living                      Prior Function            PT Goals (current goals can now be found in the care plan section) Progress towards PT goals: Progressing toward goals    Frequency    Min 3X/week      PT Plan Current plan remains appropriate    Co-evaluation              AM-PAC PT "6 Clicks" Mobility   Outcome Measure  Help needed turning from your back to your side while in a flat bed without using bedrails?: A Little Help needed moving from lying on your back to sitting on the side of a flat bed without using bedrails?: A Little Help needed moving to and from a bed to a chair (including a wheelchair)?: A Little Help needed standing up from a chair using your arms (e.g., wheelchair or bedside chair)?: A Little Help needed to walk in hospital room?: A Little Help needed climbing 3-5 steps with a railing? : A Little 6 Click Score: 18    End of  Session   Activity Tolerance: Patient tolerated treatment well Patient left: in chair;with call bell/phone within reach   PT Visit Diagnosis: Pain;Other abnormalities of gait and mobility (R26.89) Pain - part of body: (abdomen)     Time: 5430-1484 PT Time Calculation (min) (ACUTE ONLY): 34 min  Charges:  $Gait Training: 23-37 mins                        Doreatha Massed, PT Acute Rehabilitation

## 2019-10-17 NOTE — Progress Notes (Signed)
Occupational Therapy Treatment Patient Details Name: Stephanie Blanchard MRN: [id number] DOB: Feb 14, 1951 Today's Date: 10/17/2019    History of present illness 69 yo female s/p lap SB resection, hernia repair, omentectomy, wound vac placement 10/14/19.   OT comments  Family will A pt upon DC  Follow Up Recommendations  No OT follow up    Equipment Recommendations  None recommended by OT    Recommendations for Other Services      Precautions / Restrictions Precautions Precaution Comments: abominal incision, wound vac Restrictions Weight Bearing Restrictions: No       Mobility Bed Mobility Overal bed mobility: Needs Assistance Bed Mobility: Supine to Sit     Supine to sit: Supervision;HOB elevated     General bed mobility comments: Pt OOB  Transfers Overall transfer level: Needs assistance Equipment used: Rolling walker (2 wheeled) Transfers: Sit to/from [facility] Sit to Stand: Supervision Stand pivot transfers: Supervision       General transfer comment: for safety. Cues for hand placement.    Balance Overall balance assessment: Mild deficits observed, not formally tested                                         ADL either performed or assessed with clinical judgement   ADL Overall ADL's : Needs assistance/impaired     Grooming: Standing;Wash/dry face;Supervision/safety       Lower Body Bathing: Minimal assistance;Sit to/from stand;Set up;Cueing for safety       Lower Body Dressing: Minimal assistance;Set up;Sit to/from stand       Toileting- Water quality scientist and Hygiene: Supervision/safety;Sit to/from stand;Cueing for safety;Cueing for compensatory techniques       Functional mobility during ADLs: Supervision/safety;Rolling walker       Vision Baseline Vision/History: No visual deficits     Perception     Praxis      Cognition Arousal/Alertness: Awake/alert Behavior During Therapy: WFL for  tasks assessed/performed Overall Cognitive Status: Within Functional Limits for tasks assessed                                                     Pertinent Vitals/ Pain       Pain Assessment: No/denies pain Faces Pain Scale: Hurts even more Pain Location: abdomen Pain Descriptors / Indicators: Discomfort;Sore;Tender Pain Intervention(s): Monitored during session;Repositioned         Frequency  Min 2X/week        Progress Toward Goals  OT Goals(current goals can now be found in the care plan section)  Progress towards OT goals: Progressing toward goals     Plan Discharge plan remains appropriate    Co-evaluation                 AM-PAC OT "6 Clicks" Daily Activity     Outcome Measure   Help from another person eating meals?: None Help from another person taking care of personal grooming?: None Help from another person toileting, which includes using toliet, bedpan, or urinal?: A Little Help from another person bathing (including washing, rinsing, drying)?: A Little Help from another person to put on and taking off regular upper body clothing?: None Help from another person to put on and taking off regular lower body clothing?: A  Little 6 Click Score: 21    End of Session Equipment Utilized During Treatment: Rolling walker  OT Visit Diagnosis: Muscle weakness (generalized) (M62.81)   Activity Tolerance Patient tolerated treatment well   Patient Left in chair;with call bell/phone within reach;Other (comment)(legs elevated.)   Nurse Communication Mobility status        Time: 1003-4961 OT Time Calculation (min): 20 min  Charges: OT General Charges $OT Visit: 1 Visit OT Treatments $Self Care/Home Management : 8-22 mins  Kari Baars, [place] [phone] Office- [phone], Edwena Felty D 10/17/2019, 3:37 PM

## 2019-10-17 NOTE — Progress Notes (Signed)
[facility] Progress Note  4 Days Post-Op  Subjective: CC: hiccups Emesis x1 yesterday. Denies flatus, is belching. Had a small BM after suppository. Reports she is walking multiple times daily. Pain overall controlled but some sharp abd pain with movement, esp when shifting herself in bed.  Afberile, some HTN (156/61) , HR normal WBC 12 from 14  Objective: Vital signs in last 24 hours: Temp:  [98.3 F (36.8 C)-98.8 F (37.1 C)] 98.8 F (37.1 C) (06/07 0455) Pulse Rate:  [70-72] 71 (06/07 0455) Resp:  [16-18] 16 (06/07 0455) BP: (150-158)/(59-73) 156/61 (06/07 0455) SpO2:  [98 %-100 %] 98 % (06/07 0455) Last BM Date: 10/11/19  Intake/Output from previous day: 06/06 0701 - 06/07 0700 In: 3129 [P.O.:60; I.V.:2073.6; IV Piggyback:995.4] Out: 1125 [Urine:450; Emesis/NG output:500; Drains:175] Intake/Output this shift: No intake/output data recorded.  PE: Gen:  Alert, NAD, pleasant Card:  Regular rate and rhythm, pedal pulses 2+ BL Pulm:  Normal effort, clear to auscultation bilaterally Abd: Soft, obese, mild tenderness without guarding, trochar sites c/d/i, panniculectomy site with NPWT in place holding suction. SS drainage in cannister. Skin: warm and dry, no rashes  Psych: A&Ox3   Lab Results:  Recent Labs    10/16/19 0411 10/17/19 0413  WBC 14.3* 12.4*  HGB 10.8* 11.7*  HCT 33.5* 35.9*  PLT 188 228   BMET Recent Labs    10/16/19 0411 10/17/19 0413  NA 139 139  K 3.4* 3.5  CL 103 100  CO2 27 28  GLUCOSE 108* 109*  BUN 20 20  CREATININE 0.63 0.66  CALCIUM 8.9 9.0   PT/INR No results for input(s): LABPROT, INR in the last 72 hours. CMP     Component Value Date/Time   NA 139 10/17/2019 0413   K 3.5 10/17/2019 0413   CL 100 10/17/2019 0413   CO2 28 10/17/2019 0413   GLUCOSE 109 (H) 10/17/2019 0413   BUN 20 10/17/2019 0413   CREATININE 0.66 10/17/2019 0413   CALCIUM 9.0 10/17/2019 0413   PROT 8.4 (H) 10/13/2019 1841   ALBUMIN 4.0  10/13/2019 1841   AST 23 10/13/2019 1841   ALT 19 10/13/2019 1841   ALKPHOS 23 (L) 10/13/2019 1841   BILITOT 0.7 10/13/2019 1841   GFRNONAA >60 10/17/2019 0413   GFRAA >60 10/17/2019 0413   Lipase     Component Value Date/Time   LIPASE 37 10/13/2019 1841       Studies/Results: No results found.  Anti-infectives: Anti-infectives (From admission, onward)   Start     Dose/Rate Route Frequency Ordered Stop   10/14/19 1800  gentamicin (GARAMYCIN) 560 mg in dextrose 5 % 100 mL IVPB     560 mg 114 mL/hr over 60 Minutes Intravenous Every 24 hours 10/14/19 0449     10/14/19 0600  clindamycin (CLEOCIN) IVPB 900 mg     900 mg 100 mL/hr over 30 Minutes Intravenous On call to O.R. 10/13/19 2226 10/13/19 2338   10/14/19 0600  gentamicin (GARAMYCIN) 400 mg in dextrose 5 % 100 mL IVPB     400 mg 110 mL/hr over 60 Minutes Intravenous On call to O.R. 10/13/19 2226 10/13/19 2338   10/14/19 0215  ciprofloxacin (CIPRO) IVPB 400 mg     400 mg 200 mL/hr over 60 Minutes Intravenous 2 times daily 10/14/19 0202     10/14/19 0215  metroNIDAZOLE (FLAGYL) IVPB 500 mg     500 mg 100 mL/hr over 60 Minutes Intravenous Every 8 hours 10/14/19 0202  10/13/19 2314  clindamycin (CLEOCIN) 900 MG/50ML IVPB    Note to Pharmacy: Eliberto Ivory   : cabinet override      10/13/19 2314 10/14/19 0220     Assessment/Plan Strangulated Incisional Hernia containing necrotic ileum and omentum. Small Bowel Obstruction  S/p reduction and pimary repair of incisional hernia, SBR, omentectomy, partial panniculectomy, application of wound vac 10/14/19 Dr. Johney Maine POD#3, afebrile ,VSS Await return of bowel function  Add compazine for hiccups OOB, mobilize WOC RN consulted for first post-op VAC change (M/W/F)  NPWT HH orders placed   FEN: NPO, IVF ID: cipro/flagyl 6/4, gentamycin 6/4 >> plan to D/C POD#4  VTE: SCD's, lovenox  Foley: removed 6/6  Follow up: Dr Johney Maine   LOS: 3 days    Obie Dredge,  [facility] Surgery Please see Amion for pager number during day hours 7:00am-4:30pm

## 2019-10-18 LAB — BASIC METABOLIC PANEL
Anion gap: 9 (ref 5–15)
BUN: 13 mg/dL (ref 8–23)
CO2: 28 mmol/L (ref 22–32)
Calcium: 8.3 mg/dL — ABNORMAL LOW (ref 8.9–10.3)
Chloride: 101 mmol/L (ref 98–111)
Creatinine, Ser: 0.67 mg/dL (ref 0.44–1.00)
GFR calc Af Amer: >60 mL/min (ref >60)
GFR calc non Af Amer: >60 mL/min (ref >60)
Glucose, Bld: 91 mg/dL (ref 70–99)
Potassium: 3.8 mmol/L (ref 3.5–5.1)
Sodium: 138 mmol/L (ref 135–145)

## 2019-10-18 LAB — CBC
HCT: 32.7 % — ABNORMAL LOW (ref 36.0–46.0)
HCT: 30.8 % — ABNORMAL LOW (ref 36.0–46.0)
Hemoglobin: 9.9 g/dL — ABNORMAL LOW (ref 12.0–15.0)
Hemoglobin: 10 g/dL — ABNORMAL LOW (ref 12.0–15.0)
MCH: 27.7 pg (ref 26.0–34.0)
MCH: 30 pg (ref 26.0–34.0)
MCHC: 30.3 g/dL (ref 30.0–36.0)
MCHC: 32.5 g/dL (ref 30.0–36.0)
MCV: 91.3 fL (ref 80.0–100.0)
MCV: 92.5 fL (ref 80.0–100.0)
Platelets: 289 10*3/uL (ref 150–400)
Platelets: 208 10*3/uL (ref 150–400)
RBC: 3.58 MIL/uL — ABNORMAL LOW (ref 3.87–5.11)
RBC: 3.33 MIL/uL — ABNORMAL LOW (ref 3.87–5.11)
RDW: 14.7 % (ref 11.5–15.5)
RDW: 13 % (ref 11.5–15.5)
WBC: 5.5 10*3/uL (ref 4.0–10.5)
WBC: 10.7 10*3/uL — ABNORMAL HIGH (ref 4.0–10.5)
nRBC: 0 % (ref 0.0–0.2)
nRBC: 0 % (ref 0.0–0.2)

## 2019-10-18 LAB — MAGNESIUM: Magnesium: 1.9 mg/dL (ref 1.7–2.4)

## 2019-10-18 MED ORDER — METRONIDAZOLE IN NACL 5-0.79 MG/ML-% IV SOLN
500.0000 mg | Freq: Three times a day (TID) | INTRAVENOUS | Status: AC
  Administered 2019-10-18 (×2): 500 mg via INTRAVENOUS
  Filled 2019-10-18 (×2): qty 100

## 2019-10-18 MED ORDER — CIPROFLOXACIN IN D5W 400 MG/200ML IV SOLN
400.0000 mg | Freq: Two times a day (BID) | INTRAVENOUS | Status: AC
  Administered 2019-10-18: 400 mg via INTRAVENOUS
  Filled 2019-10-18: qty 200

## 2019-10-18 MED ORDER — MAGNESIUM OXIDE 400 (241.3 MG) MG PO TABS
400.0000 mg | ORAL_TABLET | Freq: Two times a day (BID) | ORAL | Status: AC
  Administered 2019-10-18 (×2): 400 mg via ORAL
  Filled 2019-10-18 (×2): qty 1

## 2019-10-18 MED ORDER — POTASSIUM CHLORIDE 20 MEQ PO PACK
40.0000 meq | PACK | Freq: Once | ORAL | Status: AC
  Administered 2019-10-18: 40 meq via ORAL
  Filled 2019-10-18: qty 2

## 2019-10-18 NOTE — TOC Initial Note (Signed)
Transition of Care [facility]) - Initial/Assessment Note    Patient Details  Name: Stephanie Blanchard MRN: [id number] Date of Birth: 1951-01-20  [facility] CM/SW Contact:    Lia Hopping, LCSW Phone Number: 10/18/2019, 3:14 PM  Clinical Narrative:                 Orders placed for HHRN-Wound Care/Wound Vac.  CSW met with the patient at bedside to discuss plan of care at discharge. Patient will stay in [place] until she is well enough to return to her home in [place]. Patient will stay with her son and sister at [address] [place], [place] [address].  Patient insurance is local to [place]. CSW reached out to the case Hydrologist to check if the patient has out of network home health benefits. Benefits check is ongoing at this time.   Expected Discharge Plan: [place] Barriers to Discharge: Continued Medical Work up, Other (comment)(Insurance Approval.)   Patient Goals and CMS Choice Patient states their goals for this hospitalization and ongoing recovery are:: get well CMS Medicare.gov Compare Post Acute Care list provided to:: Patient Choice offered to / list presented to : Patient  Expected Discharge Plan and Services Expected Discharge Plan: [place] In-house Referral: Clinical Social Work Discharge Planning Services: CM Consult Post Acute Care Choice: [place] arrangements for the past 2 months: Single Family Home                                      Prior Living Arrangements/Services Living arrangements for the past 2 months: Single Family Home Lives with:: Self Patient language and need for interpreter reviewed:: No Do you feel safe going back to the place where you live?: Yes      Need for Family Participation in Patient Care: Yes (Comment) Care giver support system in place?: Yes (comment)   Criminal Activity/Legal Involvement Pertinent to Current Situation/Hospitalization: No - Comment  as needed  Activities of Daily Living Home Assistive Devices/Equipment: Cane (specify quad or straight) ADL Screening (condition at time of admission) Patient's cognitive ability adequate to safely complete daily activities?: Yes Is the patient deaf or have difficulty hearing?: No Does the patient have difficulty seeing, even when wearing glasses/contacts?: No Does the patient have difficulty concentrating, remembering, or making decisions?: No Patient able to express need for assistance with ADLs?: Yes Does the patient have difficulty dressing or bathing?: No Independently performs ADLs?: Yes (appropriate for developmental age) Does the patient have difficulty walking or climbing stairs?: Yes Weakness of Legs: Both Weakness of Arms/Hands: Both  Permission Sought/Granted Permission sought to share information with : Family Supports, Case Manager Permission granted to share information with : Yes, Verbal Permission Granted  Share Information with NAME: Satya Bohall  Permission granted to share info w AGENCY: Home Health agency  Permission granted to share info w Relationship: Son  Permission granted to share info w Contact Information: [phone]  Emotional Assessment Appearance:: Appears stated age Attitude/Demeanor/Rapport: Engaged Affect (typically observed): Accepting Orientation: : Oriented to Self, Oriented to Place, Oriented to  Time, Oriented to Situation Alcohol / Substance Use: Not Applicable Psych Involvement: No (comment)  Admission diagnosis:  Incarcerated incisional hernia [K43.0] SBO (small bowel obstruction) ([place]) [K56.609] Hernia with strangulation [K46.0] Strangulated ventral incisional hernia [K43.0] Patient Active Problem List   Diagnosis Date Noted  . Strangulated ventral  incisional hernia s/p SB resection & primary repair 10/14/2019 10/14/2019  . 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 Nephrolithiasis 10/13/2019  . SBO (small bowel obstruction)  ([place]) 10/13/2019  . Personal history of other infectious and parasitic diseases 07/27/2014  . Diverticulosis of sigmoid colon 08/22/2013  . Internal hemorrhoid 08/22/2013  . Bilateral primary osteoarthritis of knee 12/08/2011  . Obstructive sleep apnea 04/28/2011  . Morbid obesity with body mass index (BMI) of 45.0 to 49.9 in adult [facility]) 09/30/2010  . HTN (hypertension) 08/01/2009   PCP:  System, Pcp Not In Pharmacy:  No Pharmacies Listed    Social Determinants of Health (SDOH) Interventions    Readmission Risk Interventions No flowsheet data found.

## 2019-10-18 NOTE — Progress Notes (Signed)
Spoke to [place] stated visit needed to be precereted with Emergency Admission Services [phone] and UR would need to fax info to [phone] Emailed Patient Accounting Aline August and [place] they handled the precert and clinicals were sent. I called back spoke to [place] she transferred me to Benefits after verifying everything had been completed. I was next transferred to [place] who transferred me to [place] who states no coverage for out of Network coverage [facility] provider network is [facility] then [place], was then transferred to [place] who said no network provider's again.

## 2019-10-18 NOTE — TOC Benefit Eligibility Note (Signed)
Transition of Care [facility]) Benefit Eligibility Note    Patient Details  Name: Stephanie Blanchard MRN: [id number] Date of Birth: 1950/12/22   Medication/Dose: NA  Covered?: No     Prescription Coverage Preferred Pharmacy: [facility] with Person/Company/Phone Number:: Elige Radon Medicare [employer]vant[employer] [phone]  Co-Pay: NA  Prior Approval: No          Kerin Salen Phone Number: 10/18/2019, 4:23 PM

## 2019-10-18 NOTE — Progress Notes (Signed)
[facility] Progress Note  5 Days Post-Op  Subjective: CC: hiccups Ongoing belching and nausea. One "tiny" BM and associated gas when she sat on the toilet. Pain overall controlled - reports some intermittent, cramping pain LLQ. Mobilizing 4x daily. Denies urinary sxs.    AFVSS WBC 10.7 from 12 Objective: Vital signs in last 24 hours: Temp:  [97.9 F (36.6 C)-99.5 F (37.5 C)] 98.7 F (37.1 C) (06/08 0601) Pulse Rate:  [60-73] 73 (06/08 0601) Resp:  [14-20] 14 (06/08 0601) BP: (139-161)/(58-77) 148/60 (06/08 0601) SpO2:  [98 %-100 %] 98 % (06/08 0601) Last BM Date: 10/17/19  Intake/Output from previous day: 06/07 0701 - 06/08 0700 In: 3772.1 [P.O.:240; I.V.:2284; IV Piggyback:1248.1] Out: 50 [Drains:50] Intake/Output this shift: No intake/output data recorded.  PE: Gen:  Alert, NAD, pleasant Card:  Regular rate and rhythm, pedal pulses 2+ BL Pulm:  Normal effort, clear to auscultation bilaterally Abd: Soft, obese, mild tenderness without guarding, trochar sites c/d/i, panniculectomy site with NPWT in place holding suction. Small amt SS drainage in cannister. Skin: warm and dry, no rashes  Psych: A&Ox3   Lab Results:  Recent Labs    10/18/19 0345 10/18/19 0422  WBC 5.5 10.7*  HGB 9.9* 10.0*  HCT 32.7* 30.8*  PLT 289 208   BMET Recent Labs    10/17/19 0413 10/18/19 0422  NA 139 138  K 3.5 3.8  CL 100 101  CO2 28 28  GLUCOSE 109* 91  BUN 20 13  CREATININE 0.66 0.67  CALCIUM 9.0 8.3*   PT/INR No results for input(s): LABPROT, INR in the last 72 hours. CMP     Component Value Date/Time   NA 138 10/18/2019 0422   K 3.8 10/18/2019 0422   CL 101 10/18/2019 0422   CO2 28 10/18/2019 0422   GLUCOSE 91 10/18/2019 0422   BUN 13 10/18/2019 0422   CREATININE 0.67 10/18/2019 0422   CALCIUM 8.3 (L) 10/18/2019 0422   PROT 8.4 (H) 10/13/2019 1841   ALBUMIN 4.0 10/13/2019 1841   AST 23 10/13/2019 1841   ALT 19 10/13/2019 1841   ALKPHOS 23 (L)  10/13/2019 1841   BILITOT 0.7 10/13/2019 1841   GFRNONAA >60 10/18/2019 0422   GFRAA >60 10/18/2019 0422   Lipase     Component Value Date/Time   LIPASE 37 10/13/2019 1841       Studies/Results: No results found.  Anti-infectives: Anti-infectives (From admission, onward)   Start     Dose/Rate Route Frequency Ordered Stop   10/14/19 1800  gentamicin (GARAMYCIN) 560 mg in dextrose 5 % 100 mL IVPB     560 mg 114 mL/hr over 60 Minutes Intravenous Every 24 hours 10/14/19 0449     10/14/19 0600  clindamycin (CLEOCIN) IVPB 900 mg     900 mg 100 mL/hr over 30 Minutes Intravenous On call to O.R. 10/13/19 2226 10/13/19 2338   10/14/19 0600  gentamicin (GARAMYCIN) 400 mg in dextrose 5 % 100 mL IVPB     400 mg 110 mL/hr over 60 Minutes Intravenous On call to O.R. 10/13/19 2226 10/13/19 2338   10/14/19 0215  ciprofloxacin (CIPRO) IVPB 400 mg     400 mg 200 mL/hr over 60 Minutes Intravenous 2 times daily 10/14/19 0202     10/14/19 0215  metroNIDAZOLE (FLAGYL) IVPB 500 mg     500 mg 100 mL/hr over 60 Minutes Intravenous Every 8 hours 10/14/19 0202     10/13/19 2314  clindamycin (CLEOCIN) 900 MG/50ML IVPB  Note to Pharmacy: Eliberto Ivory   : cabinet override      10/13/19 2314 10/14/19 0220     Assessment/Plan Strangulated Incisional Hernia containing necrotic ileum and omentum. Small Bowel Obstruction  S/p reduction and pimary repair of incisional hernia, SBR, omentectomy, partial panniculectomy, application of wound vac 10/14/19 Dr. Johney Maine POD#4, afebrile ,VSS hgb 10 from 11.7, monitor Continue NPO +sips/icee from floor and await return of bowel function  Dulcolax suppository today OOB, mobilize NPWT (M/W/F) , NPWT HH orders placed  PT/OT recommending no follow up, rolling walker ordered  FEN: NPO, IVF, replete K to > 4.0 and Mg > 2.0.  ID: cipro/flagyl 6/4, gentamycin 6/4 >> plan to D/C POD#4 (end of the day today)    VTE: SCD's, lovenox  Foley: removed 6/6  Follow up: Dr  Johney Maine    LOS: 4 days    Obie Dredge, [facility] Surgery Please see Amion for pager number during day hours 7:00am-4:30pm

## 2019-10-18 NOTE — Care Management Important Message (Signed)
Important Message  Patient Details IM Letter given to [place] Case Manager to present to the Patient Name: Stephanie Blanchard MRN: [id number] Date of Birth: 03/07/1951   Medicare Important Message Given:  Yes     Kerin Salen 10/18/2019, 1:46 PM

## 2019-10-18 NOTE — Plan of Care (Signed)

## 2019-10-19 NOTE — Progress Notes (Signed)
[facility] Progress Note  6 Days Post-Op  Subjective: CC:  Pain controlled. Did not get a lot of rest yesterday due to her IV beeping and so she did not walk. Plans to walk today. Reports one, watery BM yesterday. Still more burping than flatus. Still having some nausea.   CM working on NPWT at home. If unable to get NPWT at home patient states her niece may be able to help with wound care. Objective: Vital signs in last 24 hours: Temp:  [98.6 F (37 C)-98.7 F (37.1 C)] 98.7 F (37.1 C) (06/09 0445) Pulse Rate:  [68-85] 72 (06/09 0445) Resp:  [16-18] 16 (06/09 0445) BP: (135-159)/(52-91) 148/58 (06/09 0445) SpO2:  [99 %-100 %] 99 % (06/09 0445) Last BM Date: 10/17/19  Intake/Output from previous day: 06/08 0701 - 06/09 0700 In: 3832 [P.O.:1260; I.V.:1958; IV Piggyback:614.1] Out: 0  Intake/Output this shift: No intake/output data recorded.  PE: Gen:  Alert, NAD, pleasant Card:  Regular rate and rhythm, pedal pulses 2+ BL Pulm:  Normal effort, clear to auscultation bilaterally Abd: Soft, obese, mild tenderness without guarding, trochar sites c/d/i, panniculectomy site with NPWT in place holding suction. Small amt SS drainage in cannister. Skin: warm and dry, no rashes  Psych: A&Ox3   Lab Results:  Recent Labs    10/18/19 0345 10/18/19 0422  WBC 5.5 10.7*  HGB 9.9* 10.0*  HCT 32.7* 30.8*  PLT 289 208   BMET Recent Labs    10/17/19 0413 10/18/19 0422  NA 139 138  K 3.5 3.8  CL 100 101  CO2 28 28  GLUCOSE 109* 91  BUN 20 13  CREATININE 0.66 0.67  CALCIUM 9.0 8.3*   PT/INR No results for input(s): LABPROT, INR in the last 72 hours. CMP     Component Value Date/Time   NA 138 10/18/2019 0422   K 3.8 10/18/2019 0422   CL 101 10/18/2019 0422   CO2 28 10/18/2019 0422   GLUCOSE 91 10/18/2019 0422   BUN 13 10/18/2019 0422   CREATININE 0.67 10/18/2019 0422   CALCIUM 8.3 (L) 10/18/2019 0422   PROT 8.4 (H) 10/13/2019 1841   ALBUMIN 4.0  10/13/2019 1841   AST 23 10/13/2019 1841   ALT 19 10/13/2019 1841   ALKPHOS 23 (L) 10/13/2019 1841   BILITOT 0.7 10/13/2019 1841   GFRNONAA >60 10/18/2019 0422   GFRAA >60 10/18/2019 0422   Lipase     Component Value Date/Time   LIPASE 37 10/13/2019 1841       Studies/Results: No results found.  Anti-infectives: Anti-infectives (From admission, onward)   Start     Dose/Rate Route Frequency Ordered Stop   10/18/19 2200  ciprofloxacin (CIPRO) IVPB 400 mg     400 mg 200 mL/hr over 60 Minutes Intravenous 2 times daily 10/18/19 1145 10/18/19 2245   10/18/19 1400  metroNIDAZOLE (FLAGYL) IVPB 500 mg     500 mg 100 mL/hr over 60 Minutes Intravenous Every 8 hours 10/18/19 1145 10/19/19 0004   10/14/19 1800  gentamicin (GARAMYCIN) 560 mg in dextrose 5 % 100 mL IVPB     560 mg 114 mL/hr over 60 Minutes Intravenous Every 24 hours 10/14/19 0449 10/18/19 1841   10/14/19 0600  clindamycin (CLEOCIN) IVPB 900 mg     900 mg 100 mL/hr over 30 Minutes Intravenous On call to O.R. 10/13/19 2226 10/13/19 2338   10/14/19 0600  gentamicin (GARAMYCIN) 400 mg in dextrose 5 % 100 mL IVPB     400  mg 110 mL/hr over 60 Minutes Intravenous On call to O.R. 10/13/19 2226 10/13/19 2338   10/14/19 0215  ciprofloxacin (CIPRO) IVPB 400 mg  Status:  Discontinued     400 mg 200 mL/hr over 60 Minutes Intravenous 2 times daily 10/14/19 0202 10/18/19 1145   10/14/19 0215  metroNIDAZOLE (FLAGYL) IVPB 500 mg  Status:  Discontinued     500 mg 100 mL/hr over 60 Minutes Intravenous Every 8 hours 10/14/19 0202 10/18/19 1145   10/13/19 2314  clindamycin (CLEOCIN) 900 MG/50ML IVPB    Note to Pharmacy: Eliberto Ivory   : cabinet override      10/13/19 2314 10/14/19 0220     Assessment/Plan Strangulated Incisional Hernia containing necrotic ileum and omentum. Small Bowel Obstruction S/p reduction and pimary repair of incisional hernia, SBR, omentectomy, partial panniculectomy, application of wound vac 10/14/19 Dr.  Johney Maine POD#5, afebrile ,VSS, WBC downtrending  Ongoing post-op ileus Continue CLD  OOB, mobilize NPWT (M/W/F) , NPWT HH orders placed  PT/OT recommending no follow up, rolling walker ordered  FEN: CLD  ID: cipro/flagyl 6/4, gentamycin 6/4-6/8   VTE: SCD's, lovenox  Foley: removed 6/6  Follow up: Dr Johney Maine    LOS: 5 days   Obie Dredge, [facility] Surgery Please see Amion for pager number during day hours 7:00am-4:30pm

## 2019-10-19 NOTE — Consult Note (Signed)
[place] Nurse Consult Note: Reason for Consult: Vac dressing performed. Wound type: Full thickness lower abd post-op wound; 100% beefy red, some old clotted blood to outer wound edges. Small amt pink drainage in the tubing.  Pt was medicated for pain prior to the procedure and tolerated with mod amt discomfort.  One piece black foam inserted to 142mm cont suction. WOC will plan to change dressing Q M/W/F while patient is in the hospital. Julien Girt MSN, RN, [place], [place], [place]

## 2019-10-19 NOTE — Progress Notes (Signed)
Occupational Therapy Treatment Patient Details Name: Stephanie Blanchard MRN: [id number] DOB: 18-Mar-1951 Today's Date: 10/19/2019    History of present illness 69 yo female s/p lap SB resection, hernia repair, omentectomy, wound vac placement 10/14/19.   OT comments  Treatment focused on learning compensatory strategies to improve independence with ADLs prior to discharge. Patient educated on use of toilet aide and ADL kit for bathing and dressing, and need for [facility] for toilet transfers.   Follow Up Recommendations  No OT follow up    Equipment Recommendations  (Wide BSC, ADL kit, toilet aide, RW)    Recommendations for Other Services      Precautions / Restrictions Precautions Precaution Comments: abominal incision, wound vac Restrictions Weight Bearing Restrictions: No       Mobility Bed Mobility Overal bed mobility: Needs Assistance Bed Mobility: Supine to Sit     Supine to sit: Supervision;HOB elevated     General bed mobility comments: increased time. pt reports pain with bed mobility.  Transfers Overall transfer level: Needs assistance Equipment used: Rolling walker (2 wheeled) Transfers: Sit to/from Stand Sit to Stand: Supervision         General transfer comment: Patient able to ambulate in room with supervision. Therapist managed IV pole and wound vac line.    Balance Overall balance assessment: Mild deficits observed, not formally tested                                         ADL either performed or assessed with clinical judgement   ADL                 Lower Body Bathing Details (indicate cue type and reason): Discussed use of long handled sponge to reach lower body.       Lower Body Dressing Details (indicate cue type and reason): Discussed need for ADL kit - reacher, sock aide, shoe horn for donning clothing at discharge. Showed patient ADL kit online and discussed with patient's niece to purchase for patient at  patient's request. Toilet Transfer: Regular Glass blower/designer Details (indicate cue type and reason): Patient performed toilet transfer on standard height toilet with patient reporting some mild complaints of pain. Recommend BSC for home use. Toileting- Clothing Manipulation and Hygiene: Sit to/from stand;Cueing for safety;Cueing for compensatory techniques;Moderate assistance Toileting - Clothing Manipulation Details (indicate cue type and reason): Patient able to manage clothing but reports decreased quality in hygiene since surgery. Discussed use of toilet aide for home use and showed on the internet. Discussed with niece about purchase at patient's request.             Vision       Perception     Praxis      Cognition Arousal/Alertness: Awake/alert Behavior During Therapy: WFL for tasks assessed/performed Overall Cognitive Status: Within Functional Limits for tasks assessed                                          Exercises     Shoulder Instructions       General Comments      Pertinent Vitals/ Pain       Pain Assessment: 0-10 Faces Pain Scale: Hurts a little bit Pain Location: low abdomen Pain Descriptors / Indicators: Discomfort;Sore;Tender Pain Intervention(s): Monitored during  session  Home Living                                          Prior Functioning/Environment              Frequency  Min 2X/week        Progress Toward Goals  OT Goals(current goals can now be found in the care plan section)  Progress towards OT goals: Progressing toward goals  Acute Rehab OT Goals Patient Stated Goal: maintain independence OT Goal Formulation: With patient  Plan Discharge plan remains appropriate    Co-evaluation                 AM-PAC OT "6 Clicks" Daily Activity     Outcome Measure   Help from another person eating meals?: None Help from another person taking care of personal grooming?: None Help  from another person toileting, which includes using toliet, bedpan, or urinal?: A Lot Help from another person bathing (including washing, rinsing, drying)?: A Little Help from another person to put on and taking off regular upper body clothing?: None Help from another person to put on and taking off regular lower body clothing?: A Little 6 Click Score: 20    End of Session Equipment Utilized During Treatment: Rolling walker  OT Visit Diagnosis: Muscle weakness (generalized) (M62.81)   Activity Tolerance Patient tolerated treatment well   Patient Left in bed;with call bell/phone within reach;with SCD's reapplied   Nurse Communication Other (comment)(IV beeping)        Time: 0175-1025 OT Time Calculation (min): 29 min  Charges: OT General Charges $OT Visit: 1 Visit OT Treatments $Self Care/Home Management : 23-37 mins  Derl Barrow, OTR/L Acute Care Rehab Services  Office [phone]    Lenward Chancellor 10/19/2019, 4:58 PM

## 2019-10-19 NOTE — TOC Progression Note (Addendum)
Transition of Care [facility]) - Progression Note    Patient Details  Name: Stephanie Blanchard MRN: [id number] Date of Birth: [birth year]  Transition of Care [facility]) CM/SW [place], LCSW Phone Number: 10/19/2019, 9:57 AM  Clinical Narrative:    [employer] wound Vac ordered for home. [facility] PA Obie Dredge completed the order form. CSW faxed to [facility] representative Vena Rua. Plan for discharge home  6/11.  Home Health RN arrangement ongoing.  Patient informed CSW she is unable to pay privately for home health services.   1:45pm CSW received a call from [employer] wound vac, the patient insurance plan [facility] does not have a contract with [employer] and they do not out of network benefits, The patient would be responsible for out of pocket cost of about 6,000 dollars.   CSW reached out [employer], left a Advertising account executive. [place] will provide the wound vac services/Wound Vac will be delivered tomorrow or Friday morning prior to patient discharge.   Home Health RN search is ongoing.     Expected Discharge Plan: [place] Barriers to Discharge: Continued Medical Work up, Other (comment)(Insurance Approval.)  Expected Discharge Plan and Services Expected Discharge Plan: [place] In-house Referral: Clinical Social Work Discharge Planning Services: CM Consult Post Acute Care Choice: [place] arrangements for the past 2 months: Single Family Home                                       Social Determinants of Health (SDOH) Interventions    Readmission Risk Interventions No flowsheet data found.

## 2019-10-19 NOTE — Progress Notes (Signed)
Physical Therapy Treatment Patient Details Name: Stephanie Blanchard MRN: [id number] DOB: 03-21-1951 Today's Date: 10/19/2019    History of Present Illness 69 yo female s/p lap SB resection, hernia repair, omentectomy, wound vac placement 10/14/19.    PT Comments    Pt continues to mobilize well. She reports the most pain with bed mobility.    Follow Up Recommendations  No PT follow up;Supervision for mobility/OOB     Equipment Recommendations  Rolling walker with 5" wheels    Recommendations for Other Services OT consult     Precautions / Restrictions Precautions Precaution Comments: abominal incision, wound vac Restrictions Weight Bearing Restrictions: No    Mobility  Bed Mobility Overal bed mobility: Needs Assistance Bed Mobility: Supine to Sit     Supine to sit: Supervision;HOB elevated     General bed mobility comments: increased time. pt reports pain with bed mobility.  Transfers Overall transfer level: Needs assistance Equipment used: Rolling walker (2 wheeled) Transfers: Sit to/from Stand Sit to Stand: Supervision         General transfer comment: for safety. Cues for hand placement.  Ambulation/Gait Ambulation/Gait assistance: Supervision Gait Distance (Feet): 2950 Feet Assistive device: Rolling walker (2 wheeled) Gait Pattern/deviations: Step-through pattern;Decreased stride length     General Gait Details: 2 brief standing rest breaks. No LOB with RW use.   Stairs             Wheelchair Mobility    Modified Rankin (Stroke Patients Only)       Balance Overall balance assessment: Mild deficits observed, not formally tested                                          Cognition Arousal/Alertness: Awake/alert Behavior During Therapy: WFL for tasks assessed/performed Overall Cognitive Status: Within Functional Limits for tasks assessed                                        Exercises       General Comments        Pertinent Vitals/Pain Pain Assessment: 0-10 Faces Pain Scale: Hurts even more Pain Location: abdomen Pain Descriptors / Indicators: Discomfort;Sore;Tender Pain Intervention(s): Monitored during session    Home Living                      Prior Function            PT Goals (current goals can now be found in the care plan section) Progress towards PT goals: Progressing toward goals    Frequency    Min 3X/week      PT Plan Current plan remains appropriate    Co-evaluation              AM-PAC PT "6 Clicks" Mobility   Outcome Measure  Help needed turning from your back to your side while in a flat bed without using bedrails?: A Little Help needed moving from lying on your back to sitting on the side of a flat bed without using bedrails?: A Little Help needed moving to and from a bed to a chair (including a wheelchair)?: A Little Help needed standing up from a chair using your arms (e.g., wheelchair or bedside chair)?: A Little Help needed to walk in hospital room?: A Little Help  needed climbing 3-5 steps with a railing? : A Little 6 Click Score: 18    End of Session Equipment Utilized During Treatment: Gait belt Activity Tolerance: Patient tolerated treatment well Patient left: (handed over pt to OT)   PT Visit Diagnosis: Pain;Other abnormalities of gait and mobility (R26.89) Pain - part of body: (abdomen)     Time: 3582-5189 PT Time Calculation (min) (ACUTE ONLY): 30 min  Charges:  $Gait Training: 23-37 mins                         Doreatha Massed, PT Acute Rehabilitation  Office: [phone] Pager: [phone]

## 2019-10-20 LAB — BASIC METABOLIC PANEL
Anion gap: 11 (ref 5–15)
BUN: 6 mg/dL — ABNORMAL LOW (ref 8–23)
CO2: 25 mmol/L (ref 22–32)
Calcium: 8.6 mg/dL — ABNORMAL LOW (ref 8.9–10.3)
Chloride: 101 mmol/L (ref 98–111)
Creatinine, Ser: 0.65 mg/dL (ref 0.44–1.00)
GFR calc Af Amer: >60 mL/min (ref >60)
GFR calc non Af Amer: >60 mL/min (ref >60)
Glucose, Bld: 90 mg/dL (ref 70–99)
Potassium: 3.6 mmol/L (ref 3.5–5.1)
Sodium: 137 mmol/L (ref 135–145)

## 2019-10-20 LAB — CBC
HCT: 32.7 % — ABNORMAL LOW (ref 36.0–46.0)
Hemoglobin: 10.5 g/dL — ABNORMAL LOW (ref 12.0–15.0)
MCH: 29.3 pg (ref 26.0–34.0)
MCHC: 32.1 g/dL (ref 30.0–36.0)
MCV: 91.3 fL (ref 80.0–100.0)
Platelets: 238 10*3/uL (ref 150–400)
RBC: 3.58 MIL/uL — ABNORMAL LOW (ref 3.87–5.11)
RDW: 12.9 % (ref 11.5–15.5)
WBC: 12.2 10*3/uL — ABNORMAL HIGH (ref 4.0–10.5)
nRBC: 0 % (ref 0.0–0.2)

## 2019-10-20 MED ORDER — POTASSIUM CHLORIDE CRYS ER 20 MEQ PO TBCR
40.0000 meq | EXTENDED_RELEASE_TABLET | Freq: Two times a day (BID) | ORAL | Status: AC
  Administered 2019-10-20 (×2): 40 meq via ORAL
  Filled 2019-10-20 (×2): qty 2

## 2019-10-20 MED ORDER — METHOCARBAMOL 500 MG PO TABS
1000.0000 mg | ORAL_TABLET | Freq: Three times a day (TID) | ORAL | Status: DC | PRN
  Administered 2019-10-22: 1000 mg via ORAL
  Filled 2019-10-20: qty 2

## 2019-10-20 NOTE — TOC Progression Note (Signed)
Transition of Care [facility]) - Progression Note    Patient Details  Name: Stephanie Blanchard MRN: [id number] Date of Birth: 1950/12/05  Transition of Care [facility]) CM/SW [place], LCSW Phone Number: 10/20/2019, 2:19 PM  Clinical Narrative:   CSW reached out to [place] to discuss barrier to [facility]. [facility]. No additional assistance can be provided.  CSW notified [place]. Per PA, the vac will be removed and changed to daily wet to dry dressing changes.CSW confirm the patient sister Stephanie Blanchard) and niece will assist with the patient wound care. The PA will provide teaching and education to family.  CSW canceled wound vac order with [employer].  TOC staff will continue to follow this patient.   Patient will need DME orders for  3 in 1 (bariatric) and a RW.    Expected Discharge Plan: [place] Barriers to Discharge: Continued Medical Work up, Other (comment) Investment banker, corporate.)  Expected Discharge Plan and Services Expected Discharge Plan: [place] In-house Referral: Clinical Social Work Discharge Planning Services: CM Consult Post Acute Care Choice: [place] arrangements for the past 2 months: Single Family Home                                       Social Determinants of Health (SDOH) Interventions    Readmission Risk Interventions No flowsheet data found.

## 2019-10-20 NOTE — Progress Notes (Signed)
[facility] Progress Note  7 Days Post-Op  Subjective: CC:  NAEO. Had multiple loose, bilious BMs yesterday. Reports scant blood in first couple of BMs but last couple BMs without blood. Reports some cramping abdominal pain that improves with medication. Denies urinary sxs.   Objective: Vital signs in last 24 hours: Temp:  [99.4 F (37.4 C)-99.6 F (37.6 C)] 99.6 F (37.6 C) (06/10 0537) Pulse Rate:  [71-78] 71 (06/10 0537) Resp:  [16-20] 20 (06/10 0537) BP: (153-165)/(58-83) 153/58 (06/10 0537) SpO2:  [99 %-100 %] 100 % (06/10 0537) Last BM Date: 10/19/19  Intake/Output from previous day: 06/09 0701 - 06/10 0700 In: 2611 [P.O.:300; I.V.:2211; IV Piggyback:100] Out: -  Intake/Output this shift: Total I/O In: 59 [P.O.:60] Out: -   PE: Gen:  Alert, NAD, pleasant Card:  Regular rate and rhythm, pedal pulses 2+ BL Pulm:  Normal effort, clear to auscultation bilaterally Abd: Soft, obese, mild tenderness without guarding, trochar sites c/d/i, panniculectomy site with NPWT in place holding suction. Small amt dark, SS drainage in cannister. Skin: warm and dry, no rashes  Psych: A&Ox3   Lab Results:  Recent Labs    10/18/19 0422 10/20/19 0332  WBC 10.7* 12.2*  HGB 10.0* 10.5*  HCT 30.8* 32.7*  PLT 208 238   BMET Recent Labs    10/18/19 0422 10/20/19 0332  NA 138 137  K 3.8 3.6  CL 101 101  CO2 28 25  GLUCOSE 91 90  BUN 13 6*  CREATININE 0.67 0.65  CALCIUM 8.3* 8.6*   PT/INR No results for input(s): LABPROT, INR in the last 72 hours. CMP     Component Value Date/Time   NA 137 10/20/2019 0332   K 3.6 10/20/2019 0332   CL 101 10/20/2019 0332   CO2 25 10/20/2019 0332   GLUCOSE 90 10/20/2019 0332   BUN 6 (L) 10/20/2019 0332   CREATININE 0.65 10/20/2019 0332   CALCIUM 8.6 (L) 10/20/2019 0332   PROT 8.4 (H) 10/13/2019 1841   ALBUMIN 4.0 10/13/2019 1841   AST 23 10/13/2019 1841   ALT 19 10/13/2019 1841   ALKPHOS 23 (L) 10/13/2019 1841    BILITOT 0.7 10/13/2019 1841   GFRNONAA >60 10/20/2019 0332   GFRAA >60 10/20/2019 0332   Lipase     Component Value Date/Time   LIPASE 37 10/13/2019 1841       Studies/Results: No results found.  Anti-infectives: Anti-infectives (From admission, onward)   Start     Dose/Rate Route Frequency Ordered Stop   10/18/19 2200  ciprofloxacin (CIPRO) IVPB 400 mg       "And" Linked Group Details   400 mg 200 mL/hr over 60 Minutes Intravenous 2 times daily 10/18/19 1145 10/18/19 2245   10/18/19 1400  metroNIDAZOLE (FLAGYL) IVPB 500 mg       "And" Linked Group Details   500 mg 100 mL/hr over 60 Minutes Intravenous Every 8 hours 10/18/19 1145 10/19/19 0004   10/14/19 1800  gentamicin (GARAMYCIN) 560 mg in dextrose 5 % 100 mL IVPB        560 mg 114 mL/hr over 60 Minutes Intravenous Every 24 hours 10/14/19 0449 10/18/19 1841   10/14/19 0600  clindamycin (CLEOCIN) IVPB 900 mg       "And" Linked Group Details   900 mg 100 mL/hr over 30 Minutes Intravenous On call to O.R. 10/13/19 2226 10/13/19 2338   10/14/19 0600  gentamicin (GARAMYCIN) 400 mg in dextrose 5 % 100 mL IVPB       "  And" Linked Group Details   400 mg 110 mL/hr over 60 Minutes Intravenous On call to O.R. 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 Small Bowel Obstruction S/p reduction and pimary repair of  incisional hernia, SBR, omentectomy, partial panniculectomy, application of wound vac 10/14/19 Dr. Johney Maine POD#6, afebrile ,VSS, WBC w/ slight increase to 12 from 10 - monitor  +flatus and multiple loose BMs yesterday  Advance to FLD  OOB, mobilize NPWT (M/W/F) , NPWT HH orders placed  PT/OT recommending no follow up, rolling walker ordered  FEN: FLD, potentially advance to SOFT later today ID: cipro/flagyl 6/4, gentamycin 6/4-6/8   VTE: SCD's, lovenox  Foley: removed 6/6  Follow up: Dr Johney Maine    LOS: 6 days   Obie Dredge, [facility] Please see Amion for pager number during day hours 7:00am-4:30pm

## 2019-10-20 NOTE — Plan of Care (Signed)

## 2019-10-21 LAB — CREATININE, SERUM
Creatinine, Ser: 0.66 mg/dL (ref 0.44–1.00)
GFR calc Af Amer: >60 mL/min (ref >60)
GFR calc non Af Amer: >60 mL/min (ref >60)

## 2019-10-21 MED ORDER — FAMOTIDINE 20 MG PO TABS
20.0000 mg | ORAL_TABLET | Freq: Two times a day (BID) | ORAL | Status: DC
  Administered 2019-10-21 – 2019-10-22 (×2): 20 mg via ORAL
  Filled 2019-10-21 (×2): qty 1

## 2019-10-21 MED ORDER — SODIUM CHLORIDE 0.9 % IV SOLN: 250.0000 mL | INTRAVENOUS | Status: DC | PRN

## 2019-10-21 MED ORDER — SODIUM CHLORIDE 0.9% FLUSH
3.0000 mL | Freq: Two times a day (BID) | INTRAVENOUS | Status: DC
  Administered 2019-10-21: 3 mL via INTRAVENOUS

## 2019-10-21 MED ORDER — SILVER NITRATE-POT NITRATE 75-25 % EX MISC
1.0000 | Freq: Once | CUTANEOUS | Status: AC
  Administered 2019-10-21: 1 via TOPICAL
  Filled 2019-10-21: qty 1

## 2019-10-21 MED ORDER — SODIUM CHLORIDE 0.9% FLUSH: 3.0000 mL | INTRAVENOUS | Status: DC | PRN

## 2019-10-21 NOTE — Plan of Care (Signed)

## 2019-10-21 NOTE — TOC Progression Note (Addendum)
Transition of Care [facility]) - Progression Note    Patient Details  Name: Yaniris Braddock MRN: [id number] Date of Birth: 06/22/1950  Transition of Care [facility]) CM/SW [place], [place] Phone Number: 10/21/2019, 5:15 PM  Clinical Narrative:    Patient insurance is in network with [employer] for DME equipment. CSW reached out to the rep. Jeneen Rinks. At this time  [place] does not have a RW or 3 in 1 in stock. The patient insurance will not cover equipment through [place] or other DME agencies in the area. The patient will need to pay privately for RW ($99) and 3 in1 ($150). CSW explain this to the patient.   6:00pm Patient and sister trying to arrange DME. She may want to purchase DME.  TOC staff will follow up with patient in the am.  Expected Discharge Plan: [place] Barriers to Discharge: Continued Medical Work up, Other (comment) Investment banker, corporate.)  Expected Discharge Plan and Services Expected Discharge Plan: [place] In-house Referral: Clinical Social Work Discharge Planning Services: CM Consult Post Acute Care Choice: [place] arrangements for the past 2 months: Single Family Home                                       Social Determinants of Health (SDOH) Interventions    Readmission Risk Interventions No flowsheet data found.

## 2019-10-21 NOTE — Progress Notes (Signed)
[facility] Progress Note  8 Days Post-Op  Subjective: CC:  NAEO. Tolerated SOFT diet at dinner. +flatus and loose stools. Mobilized over 3 laps yesterday. Family member coming at 25 today for wound care teaching.  Objective: Vital signs in last 24 hours: Temp:  [98 F (36.7 C)-99.3 F (37.4 C)] 98 F (36.7 C) (06/11 0531) Pulse Rate:  [70-79] 70 (06/11 0531) Resp:  [16-22] 17 (06/11 0531) BP: (145-185)/(65-79) 158/65 (06/11 0531) SpO2:  [99 %-100 %] 100 % (06/11 0531) Last BM Date: 10/20/19  Intake/Output from previous day: 06/10 0701 - 06/11 0700 In: 2588.3 [P.O.:780; I.V.:1708.3; IV Piggyback:100] Out: 0  Intake/Output this shift: No intake/output data recorded.  PE: Gen:  Alert, NAD, pleasant Card:  Regular rate and rhythm, pedal pulses 2+ BL Pulm:  Normal effort, clear to auscultation bilaterally Abd: Soft, obese, mild tenderness without guarding, trochar sites c/d/i, panniculectomy site with NPWT in place holding suction. Small amt dark, SS drainage in cannister. Skin: warm and dry, no rashes  Psych: A&Ox3   Lab Results:  Recent Labs    10/20/19 0332  WBC 12.2*  HGB 10.5*  HCT 32.7*  PLT 238   BMET Recent Labs    10/20/19 0332 10/21/19 0505  NA 137  --   K 3.6  --   CL 101  --   CO2 25  --   GLUCOSE 90  --   BUN 6*  --   CREATININE 0.65 0.66  CALCIUM 8.6*  --    PT/INR No results for input(s): LABPROT, INR in the last 72 hours. CMP     Component Value Date/Time   NA 137 10/20/2019 0332   K 3.6 10/20/2019 0332   CL 101 10/20/2019 0332   CO2 25 10/20/2019 0332   GLUCOSE 90 10/20/2019 0332   BUN 6 (L) 10/20/2019 0332   CREATININE 0.66 10/21/2019 0505   CALCIUM 8.6 (L) 10/20/2019 0332   PROT 8.4 (H) 10/13/2019 1841   ALBUMIN 4.0 10/13/2019 1841   AST 23 10/13/2019 1841   ALT 19 10/13/2019 1841   ALKPHOS 23 (L) 10/13/2019 1841   BILITOT 0.7 10/13/2019 1841   GFRNONAA >60 10/21/2019 0505   GFRAA >60 10/21/2019 0505   Lipase      Component Value Date/Time   LIPASE 37 10/13/2019 1841       Studies/Results: No results found.  Anti-infectives: Anti-infectives (From admission, onward)   Start     Dose/Rate Route Frequency Ordered Stop   10/18/19 2200  ciprofloxacin (CIPRO) IVPB 400 mg       "And" Linked Group Details   400 mg 200 mL/hr over 60 Minutes Intravenous 2 times daily 10/18/19 1145 10/18/19 2245   10/18/19 1400  metroNIDAZOLE (FLAGYL) IVPB 500 mg       "And" Linked Group Details   500 mg 100 mL/hr over 60 Minutes Intravenous Every 8 hours 10/18/19 1145 10/19/19 0004   10/14/19 1800  gentamicin (GARAMYCIN) 560 mg in dextrose 5 % 100 mL IVPB        560 mg 114 mL/hr over 60 Minutes Intravenous Every 24 hours 10/14/19 0449 10/18/19 1841   10/14/19 0600  clindamycin (CLEOCIN) IVPB 900 mg       "And" Linked Group Details   900 mg 100 mL/hr over 30 Minutes Intravenous On call to O.R. 10/13/19 2226 10/13/19 2338   10/14/19 0600  gentamicin (GARAMYCIN) 400 mg in dextrose 5 % 100 mL IVPB       "And" Linked  Group Details   400 mg 110 mL/hr over 60 Minutes Intravenous On call to O.R. 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 Small Bowel Obstruction S/p reduction and pimary repair of incisional hernia, SBR, omentectomy, partial panniculectomy, application of wound vac 10/14/19 Dr. Johney Maine -  POD#7, afebrile ,VSS - Having flatus and BMs - OOB, mobilize - D/C NPWT ([place] not covered by pts insurance) and I will perform wound care teaching with family today at 10 AM. RN to provide wound care teaching to an additonal family member tomorrow 6/12. - PT/OT recommending no follow up, rolling walker ordered  FEN: SOFT, saline lock IV ID: cipro/flagyl 6/4, gentamycin 6/4-6/8   VTE: SCD's, lovenox  Foley: removed 6/6  Follow up: wound check in [place] [facility] 6/21, Dr Johney Maine 6/28 Dispo: wound care teaching and continue SOFT diet today. Possible discharge tomorrow 6/12 if tolerating solid diet and able to care for wound at home.    LOS: 7 days   Obie Dredge, [facility] Surgery Please see Amion for pager number during day hours 7:00am-4:30pm

## 2019-10-21 NOTE — Discharge Instructions (Signed)
SURGERY: POST OP INSTRUCTIONS (Surgery for small bowel obstruction, colon resection, etc)   ######################################################################  EAT Gradually transition to a high fiber diet with a fiber supplement over the next few days after discharge  WALK Walk an hour a day.  Control your pain to do that.    CONTROL PAIN Control pain so that you can walk, sleep, tolerate sneezing/coughing, go up/down stairs.  HAVE A BOWEL MOVEMENT DAILY Keep your bowels regular to avoid problems.  OK to try a laxative to override constipation.  OK to use an antidairrheal to slow down diarrhea.  Call if not better after 2 tries  CALL IF YOU HAVE PROBLEMS/CONCERNS Call if you are still struggling despite following these instructions. Call if you have concerns not answered by these instructions  ######################################################################   DIET Follow a light diet the first few days at home.  Start with a bland diet such as soups, liquids, starchy foods, low fat foods, etc.  If you feel full, bloated, or constipated, stay on a ful liquid or pureed/blenderized diet for a few days until you feel better and no longer constipated. Be sure to drink plenty of fluids every day to avoid getting dehydrated (feeling dizzy, not urinating, etc.). Gradually add a fiber supplement to your diet over the next week.  Gradually get back to a regular solid diet.  Avoid fast food or heavy meals the first week as you are more likely to get nauseated. It is expected for your digestive tract to need a few months to get back to normal.  It is common for your bowel movements and stools to be irregular.  You will have occasional bloating and cramping that should eventually fade away.  Until you are eating solid food normally, off all pain medications, and back to regular activities; your bowels will not be normal. Focus on eating a low-fat, high fiber diet the rest of your life  (See Getting to [place], below).  CARE of your INCISION or WOUND I  In general, it is encouraged that you remove your dressing and packing, shower with soap & water, and replace your dressing once a day.  Pack the wound with clean gauze moistened with normal (0.9%) saline to keep the wound moist & uninfected.  Pressure on the dressing for 15-30 minutes will stop most wound bleeding.  Eventually your body will heal & pull the open wound closed over the next few weeks-months.  Raw open wounds will occasionally bleed or secrete yellow drainage until it heals closed.  See detailed wound instructions below:  WET TO DRY DRESSING CHANGES: TWICE DAILY Removing the Old Dressing Follow these steps to remove your dressing:  Wash your hands thoroughly with soap and warm water before and after each dressing change. Put on a pair of non-sterile gloves. Carefully remove the tape. Remove the old dressing. If it is sticking to your skin, wet it with warm water to loosen it. Remove the gauze pads or packing tape from inside your wound. Put the old dressing, packing material, and your gloves in a plastic bag. Set the bag aside.  Cleaning Your Wound Follow these steps to clean your wound:  Put on a new pair of non-sterile gloves. Use a clean, soft washcloth to gently clean your wound with warm water and soap. Your wound should not bleed much when you are cleaning it. A small amount of blood is OK. Rinse your wound with water. Gently pat it dry with a clean towel. DO NOT  rub it dry. In some cases, you can even rinse the wound while showering. Check the wound for increased redness, swelling, or a bad odor. Pay attention to the color and amount of drainage from your wound. Look for drainage that has become darker or thicker. After cleaning your wound, remove your gloves and put them in the plastic bag with the old dressing and gloves. Wash your hands again.  Changing Your Dressing Follow these steps  to put a new dressing on:  Put on a new pair of non-sterile gloves. Pour saline into a clean bowl. Place gauze pads and any packing tape you will use in the bowl. Squeeze the saline from the gauze pads or packing tape until it is no longer dripping. Place the gauze pads or packing tape in your wound. Carefully fill in the wound and any spaces under the skin. Cover the wet gauze or packing tape with a large dry dressing pad. Use tape or rolled gauze to hold this dressing in place. Put all used supplies in the plastic bag. Close it securely, then put it in a second plastic bag, and close that bag securely. Put it in the trash. Wash your hands again when you are finished.  ACTIVITIES as tolerated Start light daily activities --- self-care, walking, climbing stairs-- beginning the day after surgery.  Gradually increase activities as tolerated.  Control your pain to be active.  Stop when you are tired.  Ideally, walk several times a day, eventually an hour a day.   Most people are back to most day-to-day activities in a few weeks.  It takes 4-8 weeks to get back to unrestricted, intense activity. If you can walk 30 minutes without difficulty, it is safe to try more intense activity such as jogging, treadmill, bicycling, low-impact aerobics, swimming, etc. Save the most intensive and strenuous activity for last (Usually 4-8 weeks after surgery) such as sit-ups, heavy lifting, contact sports, etc.  Refrain from any intense heavy lifting or straining until you are off narcotics for pain control.  You will have off days, but things should improve week-by-week. DO NOT PUSH THROUGH PAIN.  Let pain be your guide: If it hurts to do something, don't do it.  Pain is your body warning you to avoid that activity for another week until the pain goes down. You may drive when you are no longer taking narcotic prescription pain medication, you can comfortably wear a seatbelt, and you can safely make sudden turns/stops to  protect yourself without hesitating due to pain. You may have sexual intercourse when it is comfortable. If it hurts to do something, stop.  PAIN CONTROL Pain after surgery or related to activity is often due to strain/injury to muscle, tendon, nerves and/or incisions.  This pain is usually short-term and will improve in a few months.  To help speed the process of healing and to get back to regular activity more quickly, DO THE FOLLOWING THINGS TOGETHER: 1. Increase activity gradually.  DO NOT PUSH THROUGH PAIN 2. Use Ice and/or Heat 3. Try Gentle Massage and/or Stretching 4. Take over the counter pain medication 5. Take Narcotic prescription pain medication for more severe pain  Good pain control = faster recovery.  It is better to take more medicine to be more active than to stay in bed all day to avoid medications. 1.  Increase activity gradually Avoid heavy lifting at first, then increase to lifting as tolerated over the next 6 weeks. Do not "push through" the pain.  Listen to your body and avoid positions and maneuvers than reproduce the pain.  Wait a few days before trying something more intense Walking an hour a day is encouraged to help your body recover faster and more safely.  Start slowly and stop when getting sore.  If you can walk 30 minutes without stopping or pain, you can try more intense activity (running, jogging, aerobics, cycling, swimming, treadmill, sex, sports, weightlifting, etc.) Remember: If it hurts to do it, then don't do it! 2. Use Ice and/or Heat You will have swelling and bruising around the incisions.  This will take several weeks to resolve. Ice packs or heating pads (6-8 times a day, 30-60 minutes at a time) will help sooth soreness & bruising. Some people prefer to use ice alone, heat alone, or alternate between ice & heat.  Experiment and see what works best for you.  Consider trying ice for the first few days to help decrease swelling and bruising; then,  switch to heat to help relax sore spots and speed recovery. Shower every day.  Short baths are fine.  It feels good!  Keep the incisions and wounds clean with soap & water.   3. Try Gentle Massage and/or Stretching Massage at the area of pain many times a day Stop if you feel pain - do not overdo it 4. Take over the counter pain medication This helps the muscle and nerve tissues become less irritable and calm down faster Choose ONE of the following over-the-counter anti-inflammatory medications: Acetaminophen 500mg  tabs (Tylenol) 1-2 pills with every meal and just before bedtime (avoid if you have liver problems or if you have acetaminophen in you narcotic prescription) Naproxen 220mg  tabs (ex. Aleve, Naprosyn) 1-2 pills twice a day (avoid if you have kidney, stomach, IBD, or bleeding problems) Ibuprofen 200mg  tabs (ex. Advil, Motrin) 3-4 pills with every meal and just before bedtime (avoid if you have kidney, stomach, IBD, or bleeding problems) Take with food/snack several times a day as directed for at least 2 weeks to help keep pain / soreness down & more manageable. 5. Take Narcotic prescription pain medication for more severe pain A prescription for strong pain control is often given to you upon discharge (for example: oxycodone/Percocet, hydrocodone/Norco/Vicodin, or tramadol/Ultram) Take your pain medication as prescribed. Be mindful that most narcotic prescriptions contain Tylenol (acetaminophen) as well - avoid taking too much Tylenol. If you are having problems/concerns with the prescription medicine (does not control pain, nausea, vomiting, rash, itching, etc.), please call us [phone] to see if we need to switch you to a different pain medicine that will work better for you and/or control your side effects better. If you need a refill on your pain medication, you must call the office before 4 pm and on weekdays only.  By federal law, prescriptions for narcotics cannot be called  into a pharmacy.  They must be filled out on paper & picked up from our office by the patient or authorized caretaker.  Prescriptions cannot be filled after 4 pm nor on weekends.    WHEN TO CALL us [phone] Severe uncontrolled or worsening pain  Fever over 101 F (38.5 C) Concerns with the incision: Worsening pain, redness, rash/hives, swelling, bleeding, or drainage Reactions / problems with new medications (itching, rash, hives, nausea, etc.) Nausea and/or vomiting Difficulty urinating Difficulty breathing Worsening fatigue, dizziness, lightheadedness, blurred vision Other concerns If you are not getting better after two weeks or are noticing you are getting worse, contact our  office [phone] for further advice.  We may need to adjust your medications, re-evaluate you in the office, send you to the emergency room, or see what other things we can do to help. The clinic staff is available to answer your questions during regular business hours (8:30am-5pm).  Please don't hesitate to call and ask to speak to one of our nurses for clinical concerns.    A surgeon from [facility] is always on call at the hospitals 24 hours/day If you have a medical emergency, go to the nearest emergency room or call 911.  FOLLOW UP in our office One the day of your discharge from the hospital (or the next business weekday), please call [facility] to set up or confirm an appointment to see your surgeon in the office for a follow-up appointment.  Usually it is 2-3 weeks after your surgery.   If you have skin staples at your incision(s), let the office know so we can set up a time in the office for the nurse to remove them (usually around 10 days after surgery). Make sure that you call for appointments the day of discharge (or the next business weekday) from the hospital to ensure a convenient appointment time. IF YOU HAVE DISABILITY OR FAMILY LEAVE FORMS, BRING THEM TO THE OFFICE  FOR PROCESSING.  DO NOT GIVE THEM TO YOUR DOCTOR.  [facility] [address], [place], [place], [place]  [phone] ? [phone] - Main [phone] - [place],  [phone] - Fax [email or web address]     Low-Fiber Eating Plan - FOLLOW THIS DIET FOR 1-2 WEEKS BEFORE GRADUALLY RESUMING A HIGH FIBER DIET, RICH IN FRUITS, VEGETABLES, AND WHOLE GRAINS.  Fiber is found in fruits, vegetables, whole grains, and beans. Eating a diet low in fiber helps to reduce how often you have bowel movements and how much you produce during a bowel movement. A low-fiber eating plan may help your digestive system heal if:  You have certain conditions, such as Crohn's disease or small bowel obstructions  You recently had radiation therapy on your pelvis or bowel.  You recently had intestinal surgery.  You have a new surgical opening in your abdomen (colostomy or ileostomy).  Your intestine is narrowed (stricture). Your health care provider will determine how long you need to stay on this diet. Your health care provider may recommend that you work with a diet and nutrition specialist (dietitian). What are tips for following this plan? General guidelines  Follow recommendations from your dietitian about how much fiber you should have each day.  Most people on this eating plan should try to eat less than 10 grams (g) of fiber each day. Your daily fiber goal is _________________ g.  Take vitamin and mineral supplements as told by your health care provider or dietitian. Chewable or liquid forms are best when on this eating plan. Reading food labels  Check food labels for the amount of dietary fiber.  Choose foods that have less than 2 grams of fiber in one serving. Cooking  Use white flour and other allowed grains for baking and cooking.  Cook meat using methods that keep it tender, such as braising or poaching.  Cook eggs until the yolk is completely  solid.  Cook with healthy oils, such as olive oil or canola oil. Meal planning   Eat 5-6 small meals throughout the day instead of 3 large meals.  If you are lactose intolerant: ? Choose low-lactose dairy  foods. ? Do not eat dairy foods, if told by your dietitian.  Limit fat and oils to less than 8 teaspoons a day.  Eat small portions of desserts. What foods are allowed? The items listed below may not be a complete list. Talk with your dietitian about what dietary choices are best for you. Grains All bread and crackers made with white flour. Waffles, pancakes, and [place] toast. Bagels. Pretzels. Melba toast, zwieback, and matzoh. Cooked and dried cereals that do not contain whole grains, added fiber, seeds, or dried fruit. CornmealDomenick Gong. Hot and cold cereals made with refined corn, wheat, rice, or oats. Plain pasta and noodles. White rice. Vegetables Well-cooked or canned vegetables without skin, seeds, or stems. Cooked potatoes without skins. Vegetable juice. Fruits Soft-cooked or canned fruits without skin and seeds. Peeled ripe banana. Applesauce. Fruit juice without pulp. Meats and other protein foods Ground meat. Tender cuts of meat or poultry. Eggs. Fish, seafood, and shellfish. Smooth nut butters. Tofu. Dairy All milk products and drinks. Lactose-free milks, including rice, soy, and almond milks. Yogurt without fruit, nuts, chocolate, or granola mix-ins. Sour cream. Cottage cheese. Cheese. Beverages Decaf coffee. Fruit and vegetable juices or smoothies (in small amounts, with no pulp or skins, and with fruits from allowed list). Sports drinks. Herbal tea. Fats and oils Olive oil, canola oil, sunflower oil, flaxseed oil, and grapeseed oil. Mayonnaise. Cream cheese. Margarine. Butter. Sweets and desserts Plain cakes and cookies. Cream pies and pies made with allowed fruits. Pudding. Custard. Fruit gelatin. Sherbet. Popsicles. Ice cream without nuts. Plain hard candy. Honey.  Jelly. Molasses. Syrups, including chocolate syrup. Chocolate. Marshmallows. Gumdrops. Seasoning and other foods Bouillon. Broth. Cream soups made from allowed foods. Strained soup. Casseroles made with allowed foods. Ketchup. Mild mustard. Mild salad dressings. Plain gravies. Vinegar. Spices in moderation. Salt. Sugar. What foods are not allowed? The items listed below may not be a complete list. Talk with your dietitian about what dietary choices are best for you. Grains Whole wheat and whole grain breads and crackers. Multigrain breads and crackers. Rye bread. Whole grain or multigrain cereals. Cereals with nuts, raisins, or coconut. Bran. Coarse wheat cereals. Granola. High-fiber cereals. Cornmeal or corn bread. Whole grain pasta. Wild or brown rice. Quinoa. Popcorn. Buckwheat. Wheat germ. Vegetables Potato skins. Raw or undercooked vegetables. All beans and bean sprouts. Cooked greens. Corn. Peas. Cabbage. Beets. Broccoli. Brussels sprouts. Cauliflower. Mushrooms. Onions. Peppers. Parsnips. Okra. Sauerkraut. Fruit Raw or dried fruit. Berries. Fruit juice with pulp. Prune juice. Meats and other protein foods Tough, fibrous meats with gristle. Fatty meat. Poultry with skin. Fried meat, Sales executive, or fish. Deli or lunch meats. Sausage, bacon, and hot dogs. Nuts and chunky nut butter. Dried peas, beans, and lentils. Dairy Yogurt with fruit, nuts, chocolate, or granola mix-ins. Beverages Caffeinated coffee and teas. Fats and oils Avocado. Coconut. Sweets and desserts Desserts, cookies, or candies that contain nuts or coconut. Dried fruit. Jams and preserves with seeds. Marmalade. Any dessert made with fruits or grains that are not allowed. Seasoning and other foods Corn tortilla chips. Soups made with vegetables or grains that are not allowed. Relish. Horseradish. Angie Fava. Olives. Summary  Most people on a low-fiber eating plan should eat less than 10 grams of fiber a day. Follow  recommendations from your dietitian about how much fiber you should have each day.  Always check food labels to see the dietary fiber content of packaged foods. In general, a low-fiber food will have fewer than 2 grams of fiber  per serving.  In general, try to avoid whole grains, raw fruits and vegetables, dried fruit, tough cuts of meat, nuts, and seeds.  Take a vitamin and mineral supplement as told by your health care provider or dietitian. This information is not intended to replace advice given to you by your health care provider. Make sure you discuss any questions you have with your health care provider. Document Revised: 08/20/2018 Document Reviewed: 07/01/2016 Elsevier Patient Education  2020 [employer].

## 2019-10-21 NOTE — Progress Notes (Signed)
PHARMACIST - PHYSICIAN COMMUNICATION   CONCERNING: IV to Oral Route Change Policy  RECOMMENDATION: This patient is receiving pepcid by the intravenous route.  Based on criteria approved by the Pharmacy and Therapeutics Committee, the intravenous medication(s) is/are being converted to the equivalent oral dose form(s).   DESCRIPTION: These criteria include:  The patient is eating (either orally or via tube) and/or has been taking other orally administered medications for a least 24 hours  The patient has no evidence of active gastrointestinal bleeding or impaired GI absorption (gastrectomy, short bowel, patient on TNA or NPO).  If you have questions about this conversion, please contact the Pharmacy Department  []   [phone] )  [facility] []   [phone] )  [facility] []   [phone] )  [facility] []   [phone] )  [facility] [x]   [phone] )  [facility]   Anneliese, Leblond T, [facility] 10/21/2019 11:04 AM

## 2019-10-21 NOTE — Consult Note (Addendum)
[facility] Nurse Consult Note: Surgical team following for assessment and plan of care for abd wound.  Pt did not obtain approval from insurance for Vac after discharge, so Vac was removed as requested and moist gauze packing applied.  Discussed plan of care with PA; demonstrated procedure to patient. Family members will plan to learn the procedure later with the PA.   Full thickness abd wound red and moist.  Pt was medicated with meds prior to the procedure and tolerated dressing change with minimal amt discomfort.  Please re-consult if further assistance is needed.  Thank-you,  Julien Girt MSN, [place], [place], [place], [place]

## 2019-10-21 NOTE — Progress Notes (Signed)
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 She is unable to utilize a regular BSC functionally.  Baxter Flattery, PT  Acute Rehab Dept [facility]) [phone] Pager [phone]  10/21/2019

## 2019-10-21 NOTE — Care Management Important Message (Signed)
Important Message  Patient Details IM Letter given to [place] Case Manager to present to the Patient Name: Stephanie Blanchard MRN: [id number] Date of Birth: December 17, 1950   Medicare Important Message Given:  Yes     Kerin Salen 10/21/2019, 12:41 PM

## 2019-10-22 MED ORDER — OXYCODONE HCL 5 MG PO TABS: 5.0000 mg | ORAL_TABLET | Freq: Three times a day (TID) | ORAL | 0 refills | Status: DC | PRN

## 2019-10-22 MED ORDER — DOCUSATE SODIUM 100 MG PO CAPS
100.0000 mg | ORAL_CAPSULE | Freq: Two times a day (BID) | ORAL | 0 refills | Status: AC
Start: 2019-10-22 — End: 2019-11-21
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
## 2019-10-22 NOTE — Anesthesia Postprocedure Evaluation (Signed)
Anesthesia Post Note  Patient: Stephanie Blanchard  Procedure(s) Performed: DIAGNOSTIC LAPAROTOMY AND BILATERAL TAP BLOCK (Abdomen) REPAIR OF STRANGULATED INCISIONAL HERNIA AND OMENTECTOMY (Abdomen) NECROTIC SMALL BOWEL RESECTION (Abdomen) APPLICATION OF WOUND VAC (Abdomen)     Patient location during evaluation: PACU Anesthesia Type: General Level of consciousness: awake and alert Pain management: pain level controlled Vital Signs Assessment: post-procedure vital signs reviewed and stable Respiratory status: spontaneous breathing, nonlabored ventilation, respiratory function stable and patient connected to nasal cannula oxygen Cardiovascular status: blood pressure returned to baseline and stable Postop Assessment: no apparent nausea or vomiting Anesthetic complications: no   No complications documented.  Last Vitals:  Vitals:   10/21/19 2236 10/22/19 0458  BP: (!) 158/64 (!) 156/62  Pulse: 77 81  Resp: 17 15  Temp: 36.7 C 36.7 C  SpO2: 100% 91%    Last Pain:  Vitals:   10/21/19 2040  TempSrc:   PainSc: 0-No pain                 Latoshia Monrroy DAVID

## 2019-10-22 NOTE — Progress Notes (Signed)
Patient cleared for DC; pain controlled, voiding, ambulating, tolerating diet. Providing DC information with ride to come pick patient up at 1pm.

## 2020-04-13 ENCOUNTER — Emergency Department (HOSPITAL_COMMUNITY): Payer: Medicare (Managed Care)

## 2020-04-13 ENCOUNTER — Other Ambulatory Visit: Payer: Self-pay

## 2020-04-13 ENCOUNTER — Emergency Department (HOSPITAL_COMMUNITY)
Admission: EM | Admit: 2020-04-13 | Discharge: 2020-04-13 | Disposition: A | Discharge status: Home / Self Care | Payer: Medicare (Managed Care) | Attending: Emergency Medicine | Admitting: Emergency Medicine

## 2020-04-13 ENCOUNTER — Encounter (HOSPITAL_COMMUNITY): Payer: Self-pay

## 2020-04-13 DIAGNOSIS — Z9889 Other specified postprocedural states: Secondary | ICD-10-CM | POA: Diagnosis not present

## 2020-04-13 DIAGNOSIS — I10 Essential (primary) hypertension: Secondary | ICD-10-CM | POA: Diagnosis not present

## 2020-04-13 DIAGNOSIS — G8929 Other chronic pain: Secondary | ICD-10-CM | POA: Insufficient documentation

## 2020-04-13 DIAGNOSIS — M25561 Pain in right knee: Secondary | ICD-10-CM | POA: Insufficient documentation

## 2020-04-13 DIAGNOSIS — M25562 Pain in left knee: Secondary | ICD-10-CM | POA: Insufficient documentation

## 2020-04-13 DIAGNOSIS — R109 Unspecified abdominal pain: Secondary | ICD-10-CM | POA: Diagnosis not present

## 2020-04-13 MED ORDER — MELOXICAM 7.5 MG PO TABS
7.5000 mg | ORAL_TABLET | Freq: Once | ORAL | Status: AC
  Administered 2020-04-13: 7.5 mg via ORAL
  Filled 2020-04-13: qty 1

## 2020-04-13 MED ORDER — OXYCODONE HCL 5 MG PO TABS: 5.0000 mg | ORAL_TABLET | Freq: Three times a day (TID) | ORAL | 0 refills | Status: AC | PRN

## 2020-04-13 MED ORDER — CYCLOBENZAPRINE HCL 10 MG PO TABS: 10.0000 mg | ORAL_TABLET | Freq: Two times a day (BID) | ORAL | 0 refills | Status: AC | PRN

## 2020-04-13 NOTE — Discharge Instructions (Signed)
Use the ice packs to reduce any inflammation. Take medications as prescribed and continue your Mobic (meloxicam).   Follow up with your doctor as needed and return to the ED with any new or worsening symptoms.

## 2020-04-13 NOTE — ED Notes (Signed)
Will reassess vital signs upon patient return from XR.
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
